# Patient Record
Sex: Female | Born: 1985 | Hispanic: Yes | Marital: Single | Smoking: Never smoker
Health system: Southern US, Community
[De-identification: ages and names within clinical notes are randomized; demographics above are authoritative.]

## PROBLEM LIST (undated history)

## (undated) DIAGNOSIS — R87629 Unspecified abnormal cytological findings in specimens from vagina: Secondary | ICD-10-CM

## (undated) DIAGNOSIS — O24419 Gestational diabetes mellitus in pregnancy, unspecified control: Secondary | ICD-10-CM

## (undated) HISTORY — DX: Unspecified abnormal cytological findings in specimens from vagina: R87.629

## (undated) HISTORY — PX: NO PAST SURGERIES: SHX2092

---

## 2018-05-20 ENCOUNTER — Ambulatory Visit (INDEPENDENT_AMBULATORY_CARE_PROVIDER_SITE_OTHER): Payer: Medicaid Other | Admitting: Obstetrics & Gynecology

## 2018-05-20 ENCOUNTER — Encounter: Payer: Self-pay | Admitting: Obstetrics & Gynecology

## 2018-05-20 ENCOUNTER — Other Ambulatory Visit (HOSPITAL_COMMUNITY)
Admission: RE | Admit: 2018-05-20 | Discharge: 2018-05-20 | Disposition: A | Discharge status: Home / Self Care | Payer: Medicaid Other | Source: Ambulatory Visit | Attending: Obstetrics & Gynecology | Admitting: Obstetrics & Gynecology

## 2018-05-20 VITALS — BP 135/83 | HR 83 | Ht 64.0 in | Wt 150.0 lb

## 2018-05-20 DIAGNOSIS — Z1151 Encounter for screening for human papillomavirus (HPV): Secondary | ICD-10-CM | POA: Insufficient documentation

## 2018-05-20 DIAGNOSIS — Z3401 Encounter for supervision of normal first pregnancy, first trimester: Secondary | ICD-10-CM

## 2018-05-20 DIAGNOSIS — O282 Abnormal cytological finding on antenatal screening of mother: Secondary | ICD-10-CM | POA: Insufficient documentation

## 2018-05-20 DIAGNOSIS — Z3687 Encounter for antenatal screening for uncertain dates: Secondary | ICD-10-CM | POA: Diagnosis not present

## 2018-05-20 DIAGNOSIS — Z3400 Encounter for supervision of normal first pregnancy, unspecified trimester: Secondary | ICD-10-CM | POA: Insufficient documentation

## 2018-05-20 DIAGNOSIS — Z8349 Family history of other endocrine, nutritional and metabolic diseases: Secondary | ICD-10-CM | POA: Diagnosis not present

## 2018-05-20 DIAGNOSIS — Z3A10 10 weeks gestation of pregnancy: Secondary | ICD-10-CM | POA: Insufficient documentation

## 2018-05-20 LAB — OB RESULTS CONSOLE GC/CHLAMYDIA: Gonorrhea: NEGATIVE

## 2018-05-20 MED ORDER — PROMETHAZINE HCL 25 MG PO TABS: 25.0000 mg | ORAL_TABLET | Freq: Four times a day (QID) | ORAL | 2 refills | Status: DC | PRN

## 2018-05-20 MED ORDER — DOCUSATE SODIUM 100 MG PO CAPS: 100.0000 mg | ORAL_CAPSULE | Freq: Two times a day (BID) | ORAL | 2 refills | Status: DC | PRN

## 2018-05-20 NOTE — Addendum Note (Signed)
Addended by: Kathie DikeSOLA, DEANNA J on: 05/20/2018 04:33 PM   Modules accepted: Orders

## 2018-05-20 NOTE — Progress Notes (Signed)
Bedside U/S shows IUP with FHT of 178 BPM and CRL 28.682mm  GA 4655w5d

## 2018-05-20 NOTE — Progress Notes (Signed)
  Subjective:    Sabrina Rogers is a G1P0 3655w2d being seen today for her first obstetrical visit.  Her obstetrical history is significant for first pregnancy.  Having nausea and vomiting.  Lost 3 pounds.  Negative ketones on dip today.. Patient does intend to breast feed. Pregnancy history fully reviewed.  Patient reports heartburn, nausea and vomiting.  Vitals:   05/20/18 1310 05/20/18 1313  BP: 135/83   Pulse: 83   Weight: 150 lb (68 kg)   Height:  5\' 4"  (1.626 m)    HISTORY: OB History  Gravida Para Term Preterm AB Living  1            SAB TAB Ectopic Multiple Live Births               # Outcome Date GA Lbr Len/2nd Weight Sex Delivery Anes PTL Lv  1 Current            History reviewed. No pertinent past medical history. History reviewed. No pertinent surgical history. Family History  Problem Relation Age of Onset  . Hypertension Mother   . Hypothyroidism Mother   . Hypertension Father      Exam    Uterus:     Pelvic Exam:    Perineum: No Hemorrhoids   Vulva: normal   Vagina:  normal mucosa   pH: n/a   Cervix: no cervical motion tenderness and no lesions   Adnexa: normal adnexa   Bony Pelvis: average  System: Breast:  Inspection negative   Skin: normal coloration and turgor, no rashes    Neurologic: oriented, normal mood   Extremities: no deformities   HEENT sclera clear, anicteric, oropharynx clear, no lesions, neck supple with midline trachea, thyroid without masses and trachea midline   Mouth/Teeth mucous membranes moist, pharynx normal without lesions and dental hygiene good   Neck supple and no masses   Cardiovascular: regular rate and rhythm   Respiratory:  appears well, vitals normal, no respiratory distress, acyanotic, normal RR, chest clear, no wheezing, crepitations, rhonchi, normal symmetric air entry   Abdomen: soft, non-tender; bowel sounds normal; no masses,  no organomegaly   Urinary: urethral meatus normal      Assessment:    Pregnancy:  G1P0 Patient Active Problem List   Diagnosis Date Noted  . Supervision of normal first pregnancy, antepartum 05/20/2018        Plan:     Initial labs drawn. Prenatal vitamins. Problem list reviewed and updated. Genetic Screening discussed:  NIPS and AFP  Ultrasound discussed; fetal survey: ordered.  Babyscripts reduced visit--will have pt come at 12 weeks for NIPS; 15-16 weeks for next OB visit and AFP, then 20-21 weeks for the following (the 3rd OB visit)  Reviewed weight gain (BMI 26 when conceived  Phenergan for N/V Colace for constipation Tums for reflux.      Elsie LincolnKelly Leggett 05/20/2018

## 2018-05-21 LAB — TSH: TSH: 1.19 mIU/L

## 2018-05-22 LAB — URINE CULTURE, OB REFLEX

## 2018-05-22 LAB — CULTURE, OB URINE

## 2018-05-23 LAB — CYTOLOGY - PAP
CHLAMYDIA, DNA PROBE: NEGATIVE
Diagnosis: UNDETERMINED — AB
HPV: DETECTED — AB
NEISSERIA GONORRHEA: NEGATIVE

## 2018-05-25 LAB — OBSTETRIC PANEL
Antibody Screen: NOT DETECTED
BASOS PCT: 0.5 %
Basophils Absolute: 49 cells/uL (ref 0–200)
Eosinophils Absolute: 59 cells/uL (ref 15–500)
Eosinophils Relative: 0.6 %
HEMATOCRIT: 39.4 % (ref 35.0–45.0)
HEMOGLOBIN: 13 g/dL (ref 11.7–15.5)
Hepatitis B Surface Ag: NONREACTIVE
Lymphs Abs: 1431 cells/uL (ref 850–3900)
MCH: 29.9 pg (ref 27.0–33.0)
MCHC: 33 g/dL (ref 32.0–36.0)
MCV: 90.6 fL (ref 80.0–100.0)
MPV: 10.6 fL (ref 7.5–12.5)
Monocytes Relative: 5 %
Neutro Abs: 7771 cells/uL (ref 1500–7800)
Neutrophils Relative %: 79.3 %
Platelets: 318 10*3/uL (ref 140–400)
RBC: 4.35 10*6/uL (ref 3.80–5.10)
RDW: 11.8 % (ref 11.0–15.0)
RPR Ser Ql: NONREACTIVE
Rubella: 8.22 index
TOTAL LYMPHOCYTE: 14.6 %
WBC: 9.8 10*3/uL (ref 3.8–10.8)
WBCMIX: 490 {cells}/uL (ref 200–950)

## 2018-05-25 LAB — HIV ANTIBODY (ROUTINE TESTING W REFLEX): HIV 1&2 Ab, 4th Generation: NONREACTIVE

## 2018-05-25 LAB — CYSTIC FIBROSIS DIAGNOSTIC STUDY

## 2018-05-25 LAB — SICKLE CELL SCREEN: Sickle Solubility Test - HGBRFX: NEGATIVE

## 2018-05-27 ENCOUNTER — Encounter: Payer: Self-pay | Admitting: Obstetrics & Gynecology

## 2018-05-27 DIAGNOSIS — R87810 Cervical high risk human papillomavirus (HPV) DNA test positive: Secondary | ICD-10-CM

## 2018-05-27 DIAGNOSIS — R87610 Atypical squamous cells of undetermined significance on cytologic smear of cervix (ASC-US): Secondary | ICD-10-CM | POA: Insufficient documentation

## 2018-06-03 ENCOUNTER — Ambulatory Visit: Payer: Medicaid Other

## 2018-06-03 ENCOUNTER — Ambulatory Visit (INDEPENDENT_AMBULATORY_CARE_PROVIDER_SITE_OTHER): Payer: Medicaid Other | Admitting: Obstetrics & Gynecology

## 2018-06-03 VITALS — BP 127/77 | HR 88 | Wt 151.0 lb

## 2018-06-03 DIAGNOSIS — Z3400 Encounter for supervision of normal first pregnancy, unspecified trimester: Secondary | ICD-10-CM

## 2018-06-03 DIAGNOSIS — R87610 Atypical squamous cells of undetermined significance on cytologic smear of cervix (ASC-US): Secondary | ICD-10-CM

## 2018-06-03 DIAGNOSIS — Z3401 Encounter for supervision of normal first pregnancy, first trimester: Secondary | ICD-10-CM

## 2018-06-03 DIAGNOSIS — Z1379 Encounter for other screening for genetic and chromosomal anomalies: Secondary | ICD-10-CM

## 2018-06-03 NOTE — Progress Notes (Signed)
Colpo and NIPS

## 2018-06-03 NOTE — Progress Notes (Signed)
   PRENATAL VISIT NOTE  Subjective:  Sabrina Rogers is a 32 y.o. G1P0 at 1930w2d being seen today for ongoing prenatal care.  She is currently monitored for the following issues for this low-risk pregnancy and has Supervision of normal first pregnancy, antepartum and ASCUS with positive high risk HPV cervical on their problem list.  Patient reports no complaints.   . Vag. Bleeding: None.  Movement: Absent. Denies leaking of fluid.   The following portions of the patient's history were reviewed and updated as appropriate: allergies, current medications, past family history, past medical history, past social history, past surgical history and problem list. Problem list updated.  Objective:   Vitals:   06/03/18 1403  BP: 127/77  Pulse: 88  Weight: 151 lb (68.5 kg)    Fetal Status:     Movement: Absent     General:  Alert, oriented and cooperative. Patient is in no acute distress.  Skin: Skin is warm and dry. No rash noted.   Cardiovascular: Normal heart rate noted  Respiratory: Normal respiratory effort, no problems with respiration noted  Abdomen: Soft, gravid, appropriate for gestational age.        Pelvic: Cervical exam deferred        Extremities: Normal range of motion.  Edema: None  Mental Status: Normal mood and affect. Normal behavior. Normal judgment and thought content.  UPT negative, consent signed, time out done Cervix prepped with acetic acid. Transformation zone seen in its entirety. Colpo adequate. colpo normal She tolerated the procedure well.   Assessment and Plan:  Pregnancy: G1P0 at 7930w2d  1. Genetic screening  - Genetic Screening  2. Supervision of normal first pregnancy, antepartum 3. ASCUS- colpo normal today, rec repeat pap post partum  Preterm labor symptoms and general obstetric precautions including but not limited to vaginal bleeding, contractions, leaking of fluid and fetal movement were reviewed in detail with the patient. Please refer to After  Visit Summary for other counseling recommendations.  Return in about 4 weeks (around 07/01/2018).  Future Appointments  Date Time Provider Department Center  07/01/2018  1:15 PM Allie Bossierove, Evadene Wardrip C, MD [facility]    Allie BossierMyra C Loie Jahr, MD

## 2018-06-04 ENCOUNTER — Encounter: Payer: Self-pay | Admitting: *Deleted

## 2018-06-04 DIAGNOSIS — Z3400 Encounter for supervision of normal first pregnancy, unspecified trimester: Secondary | ICD-10-CM

## 2018-06-18 ENCOUNTER — Other Ambulatory Visit: Payer: Medicaid Other

## 2018-07-01 ENCOUNTER — Ambulatory Visit (INDEPENDENT_AMBULATORY_CARE_PROVIDER_SITE_OTHER): Payer: Medicaid Other | Admitting: Obstetrics & Gynecology

## 2018-07-01 VITALS — Wt 148.0 lb

## 2018-07-01 DIAGNOSIS — Z3400 Encounter for supervision of normal first pregnancy, unspecified trimester: Secondary | ICD-10-CM

## 2018-07-01 DIAGNOSIS — Z23 Encounter for immunization: Secondary | ICD-10-CM | POA: Diagnosis not present

## 2018-07-01 NOTE — Addendum Note (Signed)
Addended by: Granville Lewis on: 07/01/2018 01:56 PM   Modules accepted: Orders

## 2018-07-02 LAB — ALPHA FETOPROTEIN, MATERNAL
AFP MoM: 1.15
AFP, Serum: 36 ng/mL
CALC'D GESTATIONAL AGE: 16.3 wk
Maternal Wt: 151 [lb_av]
Risk for ONTD: <1
TWINS-AFP: 1

## 2018-07-15 ENCOUNTER — Encounter (HOSPITAL_COMMUNITY): Payer: Self-pay

## 2018-07-22 ENCOUNTER — Other Ambulatory Visit (HOSPITAL_COMMUNITY): Payer: Self-pay | Admitting: *Deleted

## 2018-07-22 ENCOUNTER — Ambulatory Visit (HOSPITAL_COMMUNITY)
Admission: RE | Admit: 2018-07-22 | Discharge: 2018-07-22 | Disposition: A | Discharge status: Home / Self Care | Payer: Medicaid Other | Source: Ambulatory Visit | Attending: Obstetrics & Gynecology | Admitting: Obstetrics & Gynecology

## 2018-07-22 ENCOUNTER — Ambulatory Visit (HOSPITAL_COMMUNITY): Payer: Medicaid Other

## 2018-07-22 DIAGNOSIS — Z363 Encounter for antenatal screening for malformations: Secondary | ICD-10-CM | POA: Diagnosis present

## 2018-07-22 DIAGNOSIS — Z3A19 19 weeks gestation of pregnancy: Secondary | ICD-10-CM

## 2018-07-22 DIAGNOSIS — Z362 Encounter for other antenatal screening follow-up: Secondary | ICD-10-CM

## 2018-07-22 DIAGNOSIS — Z3400 Encounter for supervision of normal first pregnancy, unspecified trimester: Secondary | ICD-10-CM

## 2018-07-29 ENCOUNTER — Encounter: Payer: Medicaid Other | Admitting: Obstetrics & Gynecology

## 2018-07-31 ENCOUNTER — Ambulatory Visit (INDEPENDENT_AMBULATORY_CARE_PROVIDER_SITE_OTHER): Payer: Medicaid Other | Admitting: Obstetrics and Gynecology

## 2018-07-31 ENCOUNTER — Encounter: Payer: Self-pay | Admitting: Obstetrics and Gynecology

## 2018-07-31 DIAGNOSIS — Z3402 Encounter for supervision of normal first pregnancy, second trimester: Secondary | ICD-10-CM

## 2018-07-31 DIAGNOSIS — Z3400 Encounter for supervision of normal first pregnancy, unspecified trimester: Secondary | ICD-10-CM

## 2018-07-31 LAB — POCT URINALYSIS DIPSTICK OB
Glucose, UA: NEGATIVE
POC,PROTEIN,UA: NEGATIVE

## 2018-07-31 NOTE — Patient Instructions (Signed)
You will be offered the Tdap vaccine at your next visit. Here is some information on it  DTaP Vaccine (Diphtheria, Tetanus, and Pertussis): What You Need to Know 1. Why get vaccinated? Diphtheria, tetanus, and pertussis are serious diseases caused by bacteria. Diphtheria and pertussis are spread from person to person. Tetanus enters the body through cuts or wounds. DIPHTHERIA causes a thick covering in the back of the throat.  It can lead to breathing problems, paralysis, heart failure, and even death.  TETANUS (Lockjaw) causes painful tightening of the muscles, usually all over the body.  It can lead to "locking" of the jaw so the victim cannot open his mouth or swallow. Tetanus leads to death in up to 2 out of 10 cases.  PERTUSSIS (Whooping Cough) causes coughing spells so bad that it is hard for infants to eat, drink, or breathe. These spells can last for weeks.  It can lead to pneumonia, seizures (jerking and staring spells), brain damage, and death.  Diphtheria, tetanus, and pertussis vaccine (DTaP) can help prevent these diseases. Most children who are vaccinated with DTaP will be protected throughout childhood. Many more children would get these diseases if we stopped vaccinating. DTaP is a safer version of an older vaccine called DTP. DTP is no longer used in the [place]. 2. Who should get DTaP vaccine and when? Children should get 5 doses of DTaP vaccine, one dose at each of the following ages:  2 months  4 months  6 months  15-18 months  4-6 years  DTaP may be given at the same time as other vaccines. 3. Some children should not get DTaP vaccine or should wait  Children with minor illnesses, such as a cold, may be vaccinated. But children who are moderately or severely ill should usually wait until they recover before getting DTaP vaccine.  Any child who had a life-threatening allergic reaction after a dose of DTaP should not get another dose.  Any child who  suffered a brain or nervous system disease within 7 days after a dose of DTaP should not get another dose.  Talk with your doctor if your child: ? had a seizure or collapsed after a dose of DTaP, ? cried non-stop for 3 hours or more after a dose of DTaP, ? had a fever over 105F after a dose of DTaP. Ask your doctor for more information. Some of these children should not get another dose of pertussis vaccine, but may get a vaccine without pertussis, called DT. 4. Older children and adults DTaP is not licensed for adolescents, adults, or children 74 years of age and older. But older people still need protection. A vaccine called Tdap is similar to DTaP. A single dose of Tdap is recommended for people 11 through 31 years of age. Another vaccine, called Td, protects against tetanus and diphtheria, but not pertussis. It is recommended every 10 years. There are separate Vaccine Information Statements for these vaccines. 5. What are the risks from DTaP vaccine? Getting diphtheria, tetanus, or pertussis disease is much riskier than getting DTaP vaccine. However, a vaccine, like any medicine, is capable of causing serious problems, such as severe allergic reactions. The risk of DTaP vaccine causing serious harm, or death, is extremely small. Mild problems (common)  Fever (up to about 1 child in 4)  Redness or swelling where the shot was given (up to about 1 child in 4)  Soreness or tenderness where the shot was given (up to about 1 child in  4) These problems occur more often after the 4th and 5th doses of the DTaP series than after earlier doses. Sometimes the 4th or 5th dose of DTaP vaccine is followed by swelling of the entire arm or leg in which the shot was given, lasting 1-7 days (up to about 1 child in 30). Other mild problems include:  Fussiness (up to about 1 child in 3)  Tiredness or poor appetite (up to about 1 child in 10)  Vomiting (up to about 1 child in 50) These problems generally  occur 1-3 days after the shot. Moderate problems (uncommon)  Seizure (jerking or staring) (about 1 child out of 14,000)  Non-stop crying, for 3 hours or more (up to about 1 child out of 1,000)  High fever, over 105F (about 1 child out of 16,000) Severe problems (very rare)  Serious allergic reaction (less than 1 out of a million doses)  Several other severe problems have been reported after DTaP vaccine. These include: ? Long-term seizures, coma, or lowered consciousness ? Permanent brain damage. These are so rare it is hard to tell if they are caused by the vaccine. Controlling fever is especially important for children who have had seizures, for any reason. It is also important if another family member has had seizures. You can reduce fever and pain by giving your child an aspirin-free pain reliever when the shot is given, and for the next 24 hours, following the package instructions. 6. What if there is a serious reaction? What should I look for? Look for anything that concerns you, such as signs of a severe allergic reaction, very high fever, or behavior changes. Signs of a severe allergic reaction can include hives, swelling of the face and throat, difficulty breathing, a fast heartbeat, dizziness, and weakness. These would start a few minutes to a few hours after the vaccination. What should I do?  If you think it is a severe allergic reaction or other emergency that can't wait, call 9-1-1 or get the person to the nearest hospital. Otherwise, call your doctor.  Afterward, the reaction should be reported to the Vaccine Adverse Event Reporting System (VAERS). Your doctor might file this report, or you can do it yourself through the VAERS web site at www.vaers.LAgents.no, or by calling 1-959-336-9317. ? VAERS is only for reporting reactions. They do not give medical advice. 7. The National Vaccine Injury Compensation Program The [employer] Vaccine Injury Compensation Program (VICP) is a  federal program that was created to compensate people who may have been injured by certain vaccines. Persons who believe they may have been injured by a vaccine can learn about the program and about filing a claim by calling 1-910-444-2804 or visiting the VICP website at SpiritualWord.at. 8. How can I learn more?  Ask your doctor.  Call your local or state health department.  Contact the Centers for Disease Control and Prevention (CDC): ? Call (418)460-5252 (1-800-CDC-INFO) or ? Visit CDC's website at PicCapture.uy CDC DTaP Vaccine (Diphtheria, Tetanus, and Pertussis) VIS (03/08/06) This information is not intended to replace advice given to you by your health care provider. Make sure you discuss any questions you have with your health care provider. Document Released: 08/06/2006 Document Revised: 06/29/2016 Document Reviewed: 06/29/2016 Elsevier Interactive Patient Education  2017 [employer].

## 2018-07-31 NOTE — Progress Notes (Signed)
   PRENATAL VISIT NOTE  Subjective:  Sabrina Rogers is a 32 y.o. G1P0 at [redacted]w[redacted]d being seen today for ongoing prenatal care.  She is currently monitored for the following issues for this low-risk pregnancy and has Supervision of normal first pregnancy, antepartum and ASCUS with positive high risk HPV cervical on their problem list.  Patient reports no complaints.   . Vag. Bleeding: None.  Movement: Present. Denies leaking of fluid.   The following portions of the patient's history were reviewed and updated as appropriate: allergies, current medications, past family history, past medical history, past social history, past surgical history and problem list. Problem list updated.  Objective:   Vitals:   07/31/18 0900  BP: 124/86  Pulse: 81  Weight: 154 lb (69.9 kg)    Fetal Status: Fetal Heart Rate (bpm): 146 Fundal Height: 20 cm Movement: Present     General:  Alert, oriented and cooperative. Patient is in no acute distress.  Skin: Skin is warm and dry. No rash noted.   Cardiovascular: Normal heart rate noted  Respiratory: Normal respiratory effort, no problems with respiration noted  Abdomen: Soft, gravid, appropriate for gestational age.  Pain/Pressure: Absent     Pelvic: Cervical exam deferred        Extremities: Normal range of motion.  Edema: None  Mental Status: Normal mood and affect. Normal behavior. Normal judgment and thought content.   Assessment and Plan:  Pregnancy: G1P0 at [redacted]w[redacted]d  1. Supervision of normal first pregnancy, antepartum Patient is doing well without complaints Follow up anatomy ultrasound 11/4 Negative genetic screening test Third trimester labs and glucola next visit  Preterm labor symptoms and general obstetric precautions including but not limited to vaginal bleeding, contractions, leaking of fluid and fetal movement were reviewed in detail with the patient. Please refer to After Visit Summary for other counseling recommendations.  Return in about 8  weeks (around 09/25/2018) for ROB, 2 hr glucola next visit.  Future Appointments  Date Time Provider Department Center  08/26/2018  2:45 PM [facility] [place] [facility]    Catalina Antigua, MD

## 2018-08-26 ENCOUNTER — Ambulatory Visit (HOSPITAL_COMMUNITY)
Admission: RE | Admit: 2018-08-26 | Discharge: 2018-08-26 | Disposition: A | Discharge status: Home / Self Care | Payer: Medicaid Other | Source: Ambulatory Visit | Attending: Obstetrics & Gynecology | Admitting: Obstetrics & Gynecology

## 2018-08-26 DIAGNOSIS — Z362 Encounter for other antenatal screening follow-up: Secondary | ICD-10-CM

## 2018-08-26 DIAGNOSIS — Z3A24 24 weeks gestation of pregnancy: Secondary | ICD-10-CM

## 2018-08-28 ENCOUNTER — Ambulatory Visit: Payer: Medicaid Other

## 2018-09-23 ENCOUNTER — Encounter: Payer: Medicaid Other | Admitting: Obstetrics & Gynecology

## 2018-09-23 ENCOUNTER — Ambulatory Visit (INDEPENDENT_AMBULATORY_CARE_PROVIDER_SITE_OTHER): Payer: Medicaid Other | Admitting: Obstetrics & Gynecology

## 2018-09-23 VITALS — BP 119/86 | HR 94 | Wt 161.0 lb

## 2018-09-23 DIAGNOSIS — Z23 Encounter for immunization: Secondary | ICD-10-CM | POA: Diagnosis not present

## 2018-09-23 DIAGNOSIS — Z3400 Encounter for supervision of normal first pregnancy, unspecified trimester: Secondary | ICD-10-CM

## 2018-09-23 DIAGNOSIS — Z3403 Encounter for supervision of normal first pregnancy, third trimester: Secondary | ICD-10-CM

## 2018-09-23 NOTE — Progress Notes (Signed)
   PRENATAL VISIT NOTE  Subjective:  Illene LabradorFatima Rogers is a 32 y.o. G1P0 at 8346w2d being seen today for ongoing prenatal care.  She is currently monitored for the following issues for this low-risk pregnancy and has Supervision of normal first pregnancy, antepartum and ASCUS with positive high risk HPV cervical on their problem list.  Patient reports no complaints.   . Vag. Bleeding: None.  Movement: Present. Denies leaking of fluid.   The following portions of the patient's history were reviewed and updated as appropriate: allergies, current medications, past family history, past medical history, past social history, past surgical history and problem list. Problem list updated.  Objective:   Vitals:   09/23/18 0809  BP: 119/86  Pulse: 94  Weight: 161 lb (73 kg)    Fetal Status: Fetal Heart Rate (bpm): 151   Movement: Present     General:  Alert, oriented and cooperative. Patient is in no acute distress.  Skin: Skin is warm and dry. No rash noted.   Cardiovascular: Normal heart rate noted  Respiratory: Normal respiratory effort, no problems with respiration noted  Abdomen: Soft, gravid, appropriate for gestational age.  Pain/Pressure: Absent     Pelvic: Cervical exam deferred        Extremities: Normal range of motion.  Edema: None  Mental Status: Normal mood and affect. Normal behavior. Normal judgment and thought content.   Assessment and Plan:  Pregnancy: G1P0 at 6646w2d  1. Supervision of normal first pregnancy, antepartum -Babyscripts not syncing.  Shows she has been in the APP.  [facility] is going to [facility] with her today and make sure values are syncing.   - CBC - HIV antibody (with reflex) - RPR - 2Hr GTT w/ 1 Hr Carpenter 75 g - Tdap vaccine greater than or equal to 7yo IM  Preterm labor symptoms and general obstetric precautions including but not limited to vaginal bleeding, contractions, leaking of fluid and fetal movement were reviewed in detail with the patient. Please  refer to After Visit Summary for other counseling recommendations.  Return in about 4 weeks (around 10/21/2018).  Future Appointments  Date Time Provider Department Center  10/21/2018  9:00 AM Allie Bossierove, Myra C, MD [facility]    Elsie LincolnKelly , MD

## 2018-09-23 NOTE — Patient Instructions (Signed)
Contraception Choices Contraception, also called birth control, refers to methods or devices that prevent pregnancy. Hormonal methods Contraceptive implant A contraceptive implant is a thin, plastic tube that contains a hormone. It is inserted into the upper part of the arm. It can remain in place for up to 3 years. Progestin-only injections Progestin-only injections are injections of progestin, a synthetic form of the hormone progesterone. They are given every 3 months by a health care provider. Birth control pills Birth control pills are pills that contain hormones that prevent pregnancy. They must be taken once a day, preferably at the same time each day. Birth control patch The birth control patch contains hormones that prevent pregnancy. It is placed on the skin and must be changed once a week for three weeks and removed on the fourth week. A prescription is needed to use this method of contraception. Vaginal ring A vaginal ring contains hormones that prevent pregnancy. It is placed in the vagina for three weeks and removed on the fourth week. After that, the process is repeated with a new ring. A prescription is needed to use this method of contraception. Emergency contraceptive Emergency contraceptives prevent pregnancy after unprotected sex. They come in pill form and can be taken up to 5 days after sex. They work best the sooner they are taken after having sex. Most emergency contraceptives are available without a prescription. This method should not be used as your only form of birth control. Barrier methods Female condom A female condom is a thin sheath that is worn over the penis during sex. Condoms keep sperm from going inside a woman's body. They can be used with a spermicide to increase their effectiveness. They should be disposed after a single use. Female condom A female condom is a soft, loose-fitting sheath that is put into the vagina before sex. The condom keeps sperm from going  inside a woman's body. They should be disposed after a single use. Diaphragm A diaphragm is a soft, dome-shaped barrier. It is inserted into the vagina before sex, along with a spermicide. The diaphragm blocks sperm from entering the uterus, and the spermicide kills sperm. A diaphragm should be left in the vagina for 6-8 hours after sex and removed within 24 hours. A diaphragm is prescribed and fitted by a health care provider. A diaphragm should be replaced every 1-2 years, after giving birth, after gaining more than 15 lb (6.8 kg), and after pelvic surgery. Cervical cap A cervical cap is a round, soft latex or plastic cup that fits over the cervix. It is inserted into the vagina before sex, along with spermicide. It blocks sperm from entering the uterus. The cap should be left in place for 6-8 hours after sex and removed within 48 hours. A cervical cap must be prescribed and fitted by a health care provider. It should be replaced every 2 years. Sponge A sponge is a soft, circular piece of polyurethane foam with spermicide on it. The sponge helps block sperm from entering the uterus, and the spermicide kills sperm. To use it, you make it wet and then insert it into the vagina. It should be inserted before sex, left in for at least 6 hours after sex, and removed and thrown away within 30 hours. Spermicides Spermicides are chemicals that kill or block sperm from entering the cervix and uterus. They can come as a cream, jelly, suppository, foam, or tablet. A spermicide should be inserted into the vagina with an applicator at least 10-15 minutes before   sex to allow time for it to work. The process must be repeated every time you have sex. Spermicides do not require a prescription. Intrauterine contraception Intrauterine device (IUD) An IUD is a T-shaped device that is put in a woman's uterus. There are two types:  Hormone IUD.This type contains progestin, a synthetic form of the hormone progesterone. This  type can stay in place for 3-5 years.  Copper IUD.This type is wrapped in copper wire. It can stay in place for 10 years.  Permanent methods of contraception Female tubal ligation In this method, a woman's fallopian tubes are sealed, tied, or blocked during surgery to prevent eggs from traveling to the uterus. Hysteroscopic sterilization In this method, a small, flexible insert is placed into each fallopian tube. The inserts cause scar tissue to form in the fallopian tubes and block them, so sperm cannot reach an egg. The procedure takes about 3 months to be effective. Another form of birth control must be used during those 3 months. Female sterilization This is a procedure to tie off the tubes that carry sperm (vasectomy). After the procedure, the man can still ejaculate fluid (semen). Natural planning methods Natural family planning In this method, a couple does not have sex on days when the woman could become pregnant. Calendar method This means keeping track of the length of each menstrual cycle, identifying the days when pregnancy can happen, and not having sex on those days. Ovulation method In this method, a couple avoids sex during ovulation. Symptothermal method This method involves not having sex during ovulation. The woman typically checks for ovulation by watching changes in her temperature and in the consistency of cervical mucus. Post-ovulation method In this method, a couple waits to have sex until after ovulation. Summary  Contraception, also called birth control, means methods or devices that prevent pregnancy.  Hormonal methods of contraception include implants, injections, pills, patches, vaginal rings, and emergency contraceptives.  Barrier methods of contraception can include female condoms, female condoms, diaphragms, cervical caps, sponges, and spermicides.  There are two types of IUDs (intrauterine devices). An IUD can be put in a woman's uterus to prevent pregnancy  for 3-5 years.  Permanent sterilization can be done through a procedure for males, females, or both.  Natural family planning methods involve not having sex on days when the woman could become pregnant. This information is not intended to replace advice given to you by your health care provider. Make sure you discuss any questions you have with your health care provider. Document Released: 10/09/2005 Document Revised: 11/11/2016 Document Reviewed: 11/11/2016 Elsevier Interactive Patient Education  2018 [employer]. Levonorgestrel intrauterine device (IUD) What is this medicine? LEVONORGESTREL IUD (LEE voe nor jes trel) is a contraceptive (birth control) device. The device is placed inside the uterus by a healthcare professional. It is used to prevent pregnancy. This device can also be used to treat heavy bleeding that occurs during your period. This medicine may be used for other purposes; ask your health care provider or pharmacist if you have questions. COMMON BRAND NAME(S): Minette Headland What should I tell my health care provider before I take this medicine? They need to know if you have any of these conditions: -abnormal Pap smear -cancer of the breast, uterus, or cervix -diabetes -endometritis -genital or pelvic infection now or in the past -have more than one sexual partner or your partner has more than one partner -heart disease -history of an ectopic or tubal pregnancy -immune system problems -IUD  in place -liver disease or tumor -problems with blood clots or take blood-thinners -seizures -use intravenous drugs -uterus of unusual shape -vaginal bleeding that has not been explained -an unusual or allergic reaction to levonorgestrel, other hormones, silicone, or polyethylene, medicines, foods, dyes, or preservatives -pregnant or trying to get pregnant -breast-feeding How should I use this medicine? This device is placed inside the uterus by a health care  professional. Talk to your pediatrician regarding the use of this medicine in children. Special care may be needed. Overdosage: If you think you have taken too much of this medicine contact a poison control center or emergency room at once. NOTE: This medicine is only for you. Do not share this medicine with others. What if I miss a dose? This does not apply. Depending on the brand of device you have inserted, the device will need to be replaced every 3 to 5 years if you wish to continue using this type of birth control. What may interact with this medicine? Do not take this medicine with any of the following medications: -amprenavir -bosentan -fosamprenavir This medicine may also interact with the following medications: -aprepitant -armodafinil -barbiturate medicines for inducing sleep or treating seizures -bexarotene -boceprevir -griseofulvin -medicines to treat seizures like carbamazepine, ethotoin, felbamate, oxcarbazepine, phenytoin, topiramate -modafinil -pioglitazone -rifabutin -rifampin -rifapentine -some medicines to treat HIV infection like atazanavir, efavirenz, indinavir, lopinavir, nelfinavir, tipranavir, ritonavir -St. John's wort -warfarin This list may not describe all possible interactions. Give your health care provider a list of all the medicines, herbs, non-prescription drugs, or dietary supplements you use. Also tell them if you smoke, drink alcohol, or use illegal drugs. Some items may interact with your medicine. What should I watch for while using this medicine? Visit your doctor or health care professional for regular check ups. See your doctor if you or your partner has sexual contact with others, becomes HIV positive, or gets a sexual transmitted disease. This product does not protect you against HIV infection (AIDS) or other sexually transmitted diseases. You can check the placement of the IUD yourself by reaching up to the top of your vagina with clean  fingers to feel the threads. Do not pull on the threads. It is a good habit to check placement after each menstrual period. Call your doctor right away if you feel more of the IUD than just the threads or if you cannot feel the threads at all. The IUD may come out by itself. You may become pregnant if the device comes out. If you notice that the IUD has come out use a backup birth control method like condoms and call your health care provider. Using tampons will not change the position of the IUD and are okay to use during your period. This IUD can be safely scanned with magnetic resonance imaging (MRI) only under specific conditions. Before you have an MRI, tell your healthcare provider that you have an IUD in place, and which type of IUD you have in place. What side effects may I notice from receiving this medicine? Side effects that you should report to your doctor or health care professional as soon as possible: -allergic reactions like skin rash, itching or hives, swelling of the face, lips, or tongue -fever, flu-like symptoms -genital sores -high blood pressure -no menstrual period for 6 weeks during use -pain, swelling, warmth in the leg -pelvic pain or tenderness -severe or sudden headache -signs of pregnancy -stomach cramping -sudden shortness of breath -trouble with balance, talking, or walking -unusual vaginal  bleeding, discharge -yellowing of the eyes or skin Side effects that usually do not require medical attention (report to your doctor or health care professional if they continue or are bothersome): -acne -breast pain -change in sex drive or performance -changes in weight -cramping, dizziness, or faintness while the device is being inserted -headache -irregular menstrual bleeding within first 3 to 6 months of use -nausea This list may not describe all possible side effects. Call your doctor for medical advice about side effects. You may report side effects to FDA at  1-800-FDA-1088. Where should I keep my medicine? This does not apply. NOTE: This sheet is a summary. It may not cover all possible information. If you have questions about this medicine, talk to your doctor, pharmacist, or health care provider.  2018 Elsevier/Gold Standard (2016-07-21 14:14:56)

## 2018-09-24 ENCOUNTER — Telehealth: Payer: Self-pay | Admitting: *Deleted

## 2018-09-24 DIAGNOSIS — Z3400 Encounter for supervision of normal first pregnancy, unspecified trimester: Secondary | ICD-10-CM

## 2018-09-24 DIAGNOSIS — O24419 Gestational diabetes mellitus in pregnancy, unspecified control: Secondary | ICD-10-CM

## 2018-09-24 LAB — CBC
HCT: 34.6 % — ABNORMAL LOW (ref 35.0–45.0)
Hemoglobin: 11.4 g/dL — ABNORMAL LOW (ref 11.7–15.5)
MCH: 29.8 pg (ref 27.0–33.0)
MCHC: 32.9 g/dL (ref 32.0–36.0)
MCV: 90.3 fL (ref 80.0–100.0)
MPV: 10.6 fL (ref 7.5–12.5)
PLATELETS: 240 10*3/uL (ref 140–400)
RBC: 3.83 10*6/uL (ref 3.80–5.10)
RDW: 12.4 % (ref 11.0–15.0)
WBC: 6.9 10*3/uL (ref 3.8–10.8)

## 2018-09-24 LAB — 2HR GTT W 1 HR, CARPENTER, 75 G
GLUCOSE, 1 HR, GEST: 174 mg/dL (ref 65–179)
Glucose, 2 Hr, Gest: 165 mg/dL — ABNORMAL HIGH (ref 65–152)
Glucose, Fasting, Gest: 104 mg/dL — ABNORMAL HIGH (ref 65–91)

## 2018-09-24 LAB — RPR: RPR Ser Ql: NONREACTIVE

## 2018-09-24 LAB — HIV ANTIBODY (ROUTINE TESTING W REFLEX): HIV: NONREACTIVE

## 2018-09-24 NOTE — Telephone Encounter (Signed)
LM on voicemail that her 2 Hr GTT was abnormal and she is being referred to N&D for teaching and glucose monitoring

## 2018-10-02 ENCOUNTER — Encounter: Payer: Medicaid Other | Attending: Anesthesiology | Admitting: Registered"

## 2018-10-02 ENCOUNTER — Telehealth: Payer: Self-pay | Admitting: *Deleted

## 2018-10-02 DIAGNOSIS — O99810 Abnormal glucose complicating pregnancy: Secondary | ICD-10-CM | POA: Diagnosis present

## 2018-10-02 MED ORDER — GLUCOSE BLOOD VI STRP: ORAL_STRIP | 12 refills | Status: DC

## 2018-10-02 MED ORDER — ACCU-CHEK GUIDE ME W/DEVICE KIT: 1.0000 | PACK | Freq: Four times a day (QID) | 0 refills | Status: DC

## 2018-10-02 MED ORDER — ACCU-CHEK FASTCLIX LANCETS MISC: 4.0000 | Freq: Four times a day (QID) | 12 refills | Status: DC

## 2018-10-02 NOTE — Telephone Encounter (Signed)
Pt has gone to diabetic and nutrition class today.  Per nutritionist Accu-chek Guide me meter, lancets and strips were sent to [facility] in [place].

## 2018-10-04 ENCOUNTER — Encounter: Payer: Self-pay | Admitting: Registered"

## 2018-10-04 DIAGNOSIS — O99810 Abnormal glucose complicating pregnancy: Secondary | ICD-10-CM | POA: Insufficient documentation

## 2018-10-04 NOTE — Progress Notes (Signed)

## 2018-10-21 ENCOUNTER — Encounter: Payer: Self-pay | Admitting: Obstetrics & Gynecology

## 2018-10-21 ENCOUNTER — Ambulatory Visit (INDEPENDENT_AMBULATORY_CARE_PROVIDER_SITE_OTHER): Payer: Medicaid Other | Admitting: Obstetrics & Gynecology

## 2018-10-21 VITALS — BP 110/70 | HR 89 | Wt 161.0 lb

## 2018-10-21 DIAGNOSIS — O24419 Gestational diabetes mellitus in pregnancy, unspecified control: Secondary | ICD-10-CM

## 2018-10-21 DIAGNOSIS — Z3403 Encounter for supervision of normal first pregnancy, third trimester: Secondary | ICD-10-CM

## 2018-10-21 DIAGNOSIS — Z3400 Encounter for supervision of normal first pregnancy, unspecified trimester: Secondary | ICD-10-CM

## 2018-10-21 NOTE — Progress Notes (Signed)
   PRENATAL VISIT NOTE  Subjective:  Sabrina Rogers is a 32 y.o. G1P0 at 5225w2d being seen today for ongoing prenatal care.  She is currently monitored for the following issues for this high-risk pregnancy and has Supervision of normal first pregnancy, antepartum; ASCUS with positive high risk HPV cervical; Gestational diabetes; and Abnormal glucose tolerance test (GTT) during pregnancy, antepartum on their problem list.  Patient reports no complaints.   .  .  Movement: Present. Denies leaking of fluid.   The following portions of the patient's history were reviewed and updated as appropriate: allergies, current medications, past family history, past medical history, past social history, past surgical history and problem list. Problem list updated.  Objective:   Vitals:   10/21/18 0915  BP: 110/70  Pulse: 89  Weight: 161 lb (73 kg)    Fetal Status: Fetal Heart Rate (bpm): 150   Movement: Present     General:  Alert, oriented and cooperative. Patient is in no acute distress.  Skin: Skin is warm and dry. No rash noted.   Cardiovascular: Normal heart rate noted  Respiratory: Normal respiratory effort, no problems with respiration noted  Abdomen: Soft, gravid, appropriate for gestational age.  Pain/Pressure: Absent     Pelvic: Cervical exam deferred        Extremities: Normal range of motion.  Edema: None  Mental Status: Normal mood and affect. Normal behavior. Normal judgment and thought content.   Assessment and Plan:  Pregnancy: G1P0 at 2525w2d GDM- excellent sugars with only diet only- So good, that I have told her that she can check her sugars only once per day. If they are become elevated before her next visit, she will return to checking them more frequently. There are no diagnoses linked to this encounter. Preterm labor symptoms and general obstetric precautions including but not limited to vaginal bleeding, contractions, leaking of fluid and fetal movement were reviewed in detail  with the patient. Please refer to After Visit Summary for other counseling recommendations.  No follow-ups on file.  No future appointments.  Allie BossierMyra C Elisandro Jarrett, MD

## 2018-10-23 NOTE — L&D Delivery Note (Signed)
Patient: Sabrina Rogers MRN: [id number]  GBS status: negative   Patient is a 33 y.o. now G1P1 s/p NSVD at [redacted]w[redacted]d, who was admitted for IOL for PPROM. PPROM 85h 7m prior to delivery with clear fluid.    Delivery Note At 6:39 PM a viable female was delivered via Vaginal, Spontaneous (Presentation: OA, restituted LOA ;  ).  APGAR: 8, 9; weight  pending.   Placenta status: intact, spontaneous, .  Cord: 3 vessel     Anesthesia:  Epidural  Episiotomy: None Lacerations: 1st degree repaired Suture Repair: 3.0 Est. Blood Loss (mL): 91  Mom to postpartum.  Baby to NICU.  [name] 11/06/2018, 7:09 PM  Head delivered OA. No nuchal cord present. Shoulder and body delivered in usual fashion. Infant with spontaneous cry, placed on mother's abdomen, dried and bulb suctioned. Cord clamped x 2 after 1-minute delay, and cut by family member. Cord blood drawn. Placenta delivered spontaneously with gentle cord traction. Fundus firm with massage and Pitocin. Perineum inspected and found to have first degree laceration, which was repaired with good hemostasis.

## 2018-11-03 ENCOUNTER — Other Ambulatory Visit: Payer: Self-pay

## 2018-11-03 ENCOUNTER — Inpatient Hospital Stay (HOSPITAL_BASED_OUTPATIENT_CLINIC_OR_DEPARTMENT_OTHER): Payer: Medicaid Other

## 2018-11-03 ENCOUNTER — Encounter (HOSPITAL_COMMUNITY): Payer: Self-pay

## 2018-11-03 ENCOUNTER — Inpatient Hospital Stay (HOSPITAL_COMMUNITY)
Admission: AD | Admit: 2018-11-03 | Discharge: 2018-11-08 | DRG: 807 | Disposition: A | Discharge status: Home / Self Care | Payer: Medicaid Other | Attending: Obstetrics and Gynecology | Admitting: Obstetrics and Gynecology

## 2018-11-03 DIAGNOSIS — D649 Anemia, unspecified: Secondary | ICD-10-CM | POA: Diagnosis present

## 2018-11-03 DIAGNOSIS — O24420 Gestational diabetes mellitus in childbirth, diet controlled: Secondary | ICD-10-CM | POA: Diagnosis present

## 2018-11-03 DIAGNOSIS — O42913 Preterm premature rupture of membranes, unspecified as to length of time between rupture and onset of labor, third trimester: Secondary | ICD-10-CM | POA: Diagnosis present

## 2018-11-03 DIAGNOSIS — O42919 Preterm premature rupture of membranes, unspecified as to length of time between rupture and onset of labor, unspecified trimester: Secondary | ICD-10-CM | POA: Diagnosis present

## 2018-11-03 DIAGNOSIS — Z3400 Encounter for supervision of normal first pregnancy, unspecified trimester: Secondary | ICD-10-CM

## 2018-11-03 DIAGNOSIS — O24419 Gestational diabetes mellitus in pregnancy, unspecified control: Secondary | ICD-10-CM

## 2018-11-03 DIAGNOSIS — O4290 Premature rupture of membranes, unspecified as to length of time between rupture and onset of labor, unspecified weeks of gestation: Secondary | ICD-10-CM | POA: Diagnosis not present

## 2018-11-03 DIAGNOSIS — Z3A34 34 weeks gestation of pregnancy: Secondary | ICD-10-CM

## 2018-11-03 DIAGNOSIS — O42113 Preterm premature rupture of membranes, onset of labor more than 24 hours following rupture, third trimester: Secondary | ICD-10-CM | POA: Diagnosis not present

## 2018-11-03 DIAGNOSIS — O9902 Anemia complicating childbirth: Secondary | ICD-10-CM | POA: Diagnosis present

## 2018-11-03 HISTORY — DX: Gestational diabetes mellitus in pregnancy, unspecified control: O24.419

## 2018-11-03 LAB — CBC
HCT: 34.7 % — ABNORMAL LOW (ref 36.0–46.0)
Hemoglobin: 11.6 g/dL — ABNORMAL LOW (ref 12.0–15.0)
MCH: 29.8 pg (ref 26.0–34.0)
MCHC: 33.4 g/dL (ref 30.0–36.0)
MCV: 89.2 fL (ref 80.0–100.0)
NRBC: 0 % (ref 0.0–0.2)
Platelets: 198 10*3/uL (ref 150–400)
RBC: 3.89 MIL/uL (ref 3.87–5.11)
RDW: 12.9 % (ref 11.5–15.5)
WBC: 6.9 10*3/uL (ref 4.0–10.5)

## 2018-11-03 LAB — URINALYSIS, ROUTINE W REFLEX MICROSCOPIC
Bilirubin Urine: NEGATIVE
Glucose, UA: NEGATIVE mg/dL
HGB URINE DIPSTICK: NEGATIVE
Ketones, ur: NEGATIVE mg/dL
Leukocytes, UA: NEGATIVE
Nitrite: NEGATIVE
PH: 6 (ref 5.0–8.0)
Protein, ur: NEGATIVE mg/dL
SPECIFIC GRAVITY, URINE: 1.016 (ref 1.005–1.030)

## 2018-11-03 LAB — GLUCOSE, CAPILLARY: Glucose-Capillary: 125 mg/dL — ABNORMAL HIGH (ref 70–99)

## 2018-11-03 LAB — ABO/RH: ABO/RH(D): A POS

## 2018-11-03 LAB — TYPE AND SCREEN
ABO/RH(D): A POS
Antibody Screen: NEGATIVE

## 2018-11-03 LAB — AMNISURE RUPTURE OF MEMBRANE (ROM) NOT AT ARMC: Amnisure ROM: POSITIVE

## 2018-11-03 LAB — OB RESULTS CONSOLE GBS: GBS: NEGATIVE

## 2018-11-03 MED ORDER — SODIUM CHLORIDE 0.9 % IV SOLN
250.0000 mL | INTRAVENOUS | Status: DC | PRN
  Administered 2018-11-03 – 2018-11-05 (×4): 250 mL via INTRAVENOUS

## 2018-11-03 MED ORDER — SODIUM CHLORIDE 0.9 % IV SOLN
2.0000 g | Freq: Four times a day (QID) | INTRAVENOUS | Status: AC
  Administered 2018-11-03 – 2018-11-05 (×8): 2 g via INTRAVENOUS
  Filled 2018-11-03 (×8): qty 2

## 2018-11-03 MED ORDER — ZOLPIDEM TARTRATE 5 MG PO TABS: 5.0000 mg | ORAL_TABLET | Freq: Every evening | ORAL | Status: DC | PRN

## 2018-11-03 MED ORDER — BETAMETHASONE SOD PHOS & ACET 6 (3-3) MG/ML IJ SUSP
12.0000 mg | Freq: Once | INTRAMUSCULAR | Status: AC
  Administered 2018-11-03: 12 mg via INTRAMUSCULAR
  Filled 2018-11-03: qty 2

## 2018-11-03 MED ORDER — BETAMETHASONE SOD PHOS & ACET 6 (3-3) MG/ML IJ SUSP
12.0000 mg | Freq: Once | INTRAMUSCULAR | Status: AC
  Administered 2018-11-04: 12 mg via INTRAMUSCULAR
  Filled 2018-11-03: qty 2

## 2018-11-03 MED ORDER — CALCIUM CARBONATE ANTACID 500 MG PO CHEW: 2.0000 | CHEWABLE_TABLET | ORAL | Status: DC | PRN

## 2018-11-03 MED ORDER — SODIUM CHLORIDE 0.9% FLUSH
3.0000 mL | Freq: Two times a day (BID) | INTRAVENOUS | Status: DC
  Administered 2018-11-03 – 2018-11-05 (×5): 3 mL via INTRAVENOUS

## 2018-11-03 MED ORDER — AZITHROMYCIN 500 MG PO TABS
500.0000 mg | ORAL_TABLET | Freq: Every day | ORAL | Status: DC
  Administered 2018-11-04 – 2018-11-05 (×2): 500 mg via ORAL
  Filled 2018-11-03: qty 2
  Filled 2018-11-03: qty 1
  Filled 2018-11-03: qty 2

## 2018-11-03 MED ORDER — AMOXICILLIN 500 MG PO CAPS
500.0000 mg | ORAL_CAPSULE | Freq: Three times a day (TID) | ORAL | Status: DC
  Administered 2018-11-05: 500 mg via ORAL
  Filled 2018-11-03 (×3): qty 1

## 2018-11-03 MED ORDER — SODIUM CHLORIDE 0.9% FLUSH: 3.0000 mL | INTRAVENOUS | Status: DC | PRN

## 2018-11-03 MED ORDER — PRENATAL MULTIVITAMIN CH
1.0000 | ORAL_TABLET | Freq: Every day | ORAL | Status: DC
  Administered 2018-11-03 – 2018-11-05 (×4): 1 via ORAL
  Filled 2018-11-03 (×4): qty 1

## 2018-11-03 MED ORDER — ACETAMINOPHEN 325 MG PO TABS: 650.0000 mg | ORAL_TABLET | ORAL | Status: DC | PRN

## 2018-11-03 MED ORDER — DOCUSATE SODIUM 100 MG PO CAPS
100.0000 mg | ORAL_CAPSULE | Freq: Every day | ORAL | Status: DC
  Administered 2018-11-03 – 2018-11-05 (×3): 100 mg via ORAL
  Filled 2018-11-03 (×3): qty 1

## 2018-11-03 NOTE — MAU Note (Signed)
Urine in lab 

## 2018-11-03 NOTE — MAU Note (Signed)
Pt states she had a gush of fluid around 0500 and then again around 0830.  Pt states fluid looked clear at first but was more yellow on her pad.  Pt reports good fetal movement.

## 2018-11-03 NOTE — H&P (Signed)
Sabrina Rogers is a 33 y.o. G1P0 at 34.1wks who presents for pPROM.  She states she awoke this morning to a gush of fluid around 0500.  She states she had a second occurrence around 0830.  She endorses that the fluid was clear and odorless.  She denies contractions, vaginal bleeding, or sexual activity in the past 48 hours.  Patient endorses fetal movement.   Patient was supervised for a high risk pregnancy due to gestational diabetes that was diet controlled and requiring daily monitoring. Medical history significant for ASCUS with +HPV.  Patient GBS is unknown and pending collection.  BMZ to be given prior to transfer to Antepartum and then in 24 hours..      Nursing Staff Provider  Office Location  [facility] Dating  U/S  Language  English Anatomy US  Nml; needs f/u views of heart.  Flu Vaccine  07/01/18 Genetic Screen  NIPS: Normal                  [id number]:negative   TDaP vaccine   09/23/18 Hgb A1C or  GTT  Third trimester F-104 1-174 2-165  Rhogam  NA   LAB RESULTS   Feeding Plan Breast Blood Type A/RH(D) POSITIVE/-- (07/29 1400)   Contraception Considering IUD Antibody NO ANTIBODIES DETECTED (07/29 1400)  Circumcision NA Rubella 8.22 (07/29 1400)  Pediatrician  [facility] RPR NON-REACTIVE (07/29 1400)   Support Person Sabrina Rogers HBsAg NON-REACTIVE (07/29 1400)   Prenatal Classes  HIV NON-REACTIVE (07/29 1400)  BTL Consent n/a GBS    VBAC Consent n/a Pap  ASCUS + HR HPV    Hgb Electro  Sickle negative    CF NEG    SMA Declined    Waterbirth  [ ]  Class [ ]  Consent [ ]  CNM visit    Past Surgical History:  Procedure Laterality Date  . NO PAST SURGERIES     Family History: family history includes Hypertension in her father and mother; Hypothyroidism in her mother. Social History:  reports that she has never smoked. She has never used smokeless tobacco. She reports that she does not drink alcohol or use drugs.     Maternal Diabetes: Yes:  Diabetes Type:  Diet controlled Genetic Screening:  Normal Maternal Ultrasounds/Referrals: Normal Fetal Ultrasounds or other Referrals:  None Maternal Substance Abuse:  No Significant Maternal Medications:  None Significant Maternal Lab Results:  Lab values include: Other:  GBS Unknown Other Comments:  None  ROS Maternal Medical History:  Reason for admission: Rupture of membranes.   Fetal activity: Perceived fetal activity is normal.   Last perceived fetal movement was within the past hour.    Prenatal Complications - Diabetes: gestational. Diabetes is managed by diet.      Dilation: Closed Effacement (%): Thick Station: Ballotable Exam by:: Gerrit HeckJessica Kole Hilyard CNM  Blood pressure 130/90, pulse 96, temperature 98.2 F (36.8 C), resp. rate 18, height 5\' 3"  (1.6 m), last menstrual period 03/09/2018, SpO2 98 %. Maternal Exam:  Uterine Assessment: Contraction frequency is irregular.   Abdomen: Patient reports no abdominal tenderness. Fundal height is AGA.   Fetal presentation: vertex  Introitus: Normal vulva. Normal vagina.  Ferning test: positive.  Nitrazine test: not done. Amniotic fluid character: clear.  Pelvis: adequate for delivery.   Cervix: Cervix evaluated by sterile speculum exam and digital exam.     Fetal Exam Fetal Monitor Review: Baseline rate: 155.  Variability: moderate (6-25 bpm).   Pattern: accelerations present and no decelerations.  Fetal State Assessment: Category I - tracings are normal.     Physical Exam  Constitutional: She is oriented to person, place, and time. She appears well-developed and well-nourished. No distress.  HENT:  Head: Normocephalic and atraumatic.  Eyes: Conjunctivae are normal.  Neck: Normal range of motion.  Cardiovascular: Normal rate, regular rhythm and normal heart sounds.  Respiratory: Effort normal and breath sounds normal.  GI: Soft. Bowel sounds are normal. Distention: Gravid. There is no abdominal tenderness.  Genitourinary:    Vulva normal.  Cervix exhibits  discharge (Clear mucoid discharge from os. No apparent active leaking.). Cervix exhibits no motion tenderness.    Vaginal bleeding present.  There is bleeding in the vagina.    Genitourinary Comments: Sterile Speculum Exam: -Vaginal Vault: Pink mucosa.  No apparent discharge or pooling in vault. -Cervix:Pink, no lesions, cysts, or polyps.  Appears closed. No active bleeding.  Vagal with clear mucoid discharge from os-Sample for fern collected -Bimanual Exam: Closed/Long/Thick/Ballotable/Vertex Fern +, but small isolated area; Amnisure collected   Musculoskeletal: Normal range of motion.        General: No edema.  Neurological: She is alert and oriented to person, place, and time.  Skin: Skin is warm and dry.    Results for orders placed or performed during the hospital encounter of 11/03/18 (from the past 24 hour(s))  Urinalysis, Routine w reflex microscopic     Status: Abnormal   Collection Time: 11/03/18 10:50 AM  Result Value Ref Range   Color, Urine YELLOW YELLOW   APPearance HAZY (A) CLEAR   Specific Gravity, Urine 1.016 1.005 - 1.030   pH 6.0 5.0 - 8.0   Glucose, UA NEGATIVE NEGATIVE mg/dL   Hgb urine dipstick NEGATIVE NEGATIVE   Bilirubin Urine NEGATIVE NEGATIVE   Ketones, ur NEGATIVE NEGATIVE mg/dL   Protein, ur NEGATIVE NEGATIVE mg/dL   Nitrite NEGATIVE NEGATIVE   Leukocytes, UA NEGATIVE NEGATIVE  Amnisure [facility] membrane (rom[facility]     Status: None   Collection Time: 11/03/18 11:57 AM  Result Value Ref Range   Amnisure ROM POSITIVE     Prenatal labs: ABO, Rh: A/RH(D) POSITIVE/-- (07/29 1400) Antibody: NO ANTIBODIES DETECTED (07/29 1400) Rubella: 8.22 (07/29 1400) RPR: NON-REACTIVE (12/02 0830)  HBsAg: NON-REACTIVE (07/29 1400)  HIV: NON-REACTIVE (12/02 0830)  GBS:   To be collected  Assessment/Plan: IUP at 34.1wks Cat I FT pPROM  Dr. Despina HiddenEure consulted and advises: Admit to Antepartum Routine Antepartum Orders per Protocol including -Antibiotic  Therapy -Perform US for AFI, EFW, and Presentation -BMZ initiation/completion -Will collect GBS  In room to complete assessment and discuss POC: -Admission -Plan for delivery of unknown date Q/C addressed and Reassurances given  Cherre RobinsJessica L Laaibah Wartman MSN, CNM 11/03/2018, 12:35 PM

## 2018-11-03 NOTE — Consult Note (Signed)
Neonatology Consult to Antenatal Patient:  I was asked by Dr. Despina Hidden to see this patient in order to provide antenatal counseling due to prematurity.  Ms. Macher was admitted 11/03/18 at 34 1/[redacted] weeks GA for PROM. She is currently not having active labor. No bleeding or fetal distress. She is getting BMZ, IVFL, and Ampicillin.  Pregnancy complicated by GDM diet controlled,; GBS unknown.   I spoke with the patient and her sister. We discussed the worst case of delivery in the next 1-2 days, including usual DR management, possible respiratory complications and need for support, IV access, feedings (mother desires breast feeding, which was encouraged), LOS, Mortality and Morbidity, and long term outcomes. She did not have any questions at this time. I offered a NICU tour to any interested family members and would be glad to come back if she has more questions later.  Thank you for asking me to see this patient.  Dineen Kid Leary Roca, MD Neonatologist 11/03/2018, 9:37 PM  The total length of face-to-face or floor/unit time for this encounter was 25 minutes. Counseling and/or coordination of care was 40 minutes of the above.

## 2018-11-04 ENCOUNTER — Ambulatory Visit (INDEPENDENT_AMBULATORY_CARE_PROVIDER_SITE_OTHER): Payer: Medicaid Other | Admitting: Obstetrics & Gynecology

## 2018-11-04 DIAGNOSIS — O42913 Preterm premature rupture of membranes, unspecified as to length of time between rupture and onset of labor, third trimester: Principal | ICD-10-CM

## 2018-11-04 DIAGNOSIS — O24419 Gestational diabetes mellitus in pregnancy, unspecified control: Secondary | ICD-10-CM

## 2018-11-04 LAB — GLUCOSE, CAPILLARY
GLUCOSE-CAPILLARY: 115 mg/dL — AB (ref 70–99)
Glucose-Capillary: 104 mg/dL — ABNORMAL HIGH (ref 70–99)
Glucose-Capillary: 128 mg/dL — ABNORMAL HIGH (ref 70–99)
Glucose-Capillary: 139 mg/dL — ABNORMAL HIGH (ref 70–99)

## 2018-11-04 NOTE — Progress Notes (Signed)
Patient ID: Sabrina Rogers, female   DOB: [birth year], 33 y.o.   MRN: [id number] FACULTY PRACTICE ANTEPARTUM(COMPREHENSIVE) NOTE  Sabrina Rogers is a 33 y.o. G1P0 with Estimated Date of Delivery: 12/14/18   By  LMP, early ultrasound 4770w2d  who is admitted for ? PROM.    Fetal presentation is cephalic. Length of Stay:  1  Days  Date of admission:11/03/2018  Subjective: No leaking of fluid since presentation Patient reports the fetal movement as active. Patient reports uterine contraction  activity as none. Patient reports  vaginal bleeding as none. Patient describes fluid per vagina as None.  Vitals:  Blood pressure 107/66, pulse 74, temperature 97.7 F (36.5 C), temperature source Oral, resp. rate 18, height 5\' 3"  (1.6 m), weight 73.9 kg, last menstrual period 03/09/2018, SpO2 100 %. Vitals:   11/03/18 1434 11/03/18 1953 11/03/18 2214 11/04/18 0544  BP: (!) 120/101 124/76 130/72 107/66  Pulse: (!) 112 81 84 74  Resp:  17 17 18   Temp: 98.8 F (37.1 C) 98.7 F (37.1 C) 98.2 F (36.8 C) 97.7 F (36.5 C)  TempSrc: Oral Oral Oral Oral  SpO2: 98% 99% 97% 100%  Weight: 73.9 kg     Height: 5\' 3"  (1.6 m)      Physical Examination:  General appearance - alert, well appearing, and in no distress Abdomen - soft, nontender, nondistended, no masses or organomegaly Fundal Height:  size equals dates Pelvic Exam:  examination not I Membranes:presumed ROM  Fetal Monitoring:  Baseline: 140 bpm, Variability: Good {> 6 bpm), Accelerations: Reactive and Decelerations: Absent   reactive  Labs:  Results for orders placed or performed during the hospital encounter of 11/03/18 (from the past 24 hour(s))  Urinalysis, Routine w reflex microscopic   Collection Time: 11/03/18 10:50 AM  Result Value Ref Range   Color, Urine YELLOW YELLOW   APPearance HAZY (A) CLEAR   Specific Gravity, Urine 1.016 1.005 - 1.030   pH 6.0 5.0 - 8.0   Glucose, UA NEGATIVE NEGATIVE mg/dL   Hgb urine dipstick NEGATIVE  NEGATIVE   Bilirubin Urine NEGATIVE NEGATIVE   Ketones, ur NEGATIVE NEGATIVE mg/dL   Protein, ur NEGATIVE NEGATIVE mg/dL   Nitrite NEGATIVE NEGATIVE   Leukocytes, UA NEGATIVE NEGATIVE  Amnisure rupture [facility] membrane (rom)not [facility]   Collection Time: 11/03/18 11:57 AM  Result Value Ref Range   Amnisure ROM POSITIVE   CBC on admission   Collection Time: 11/03/18  1:59 PM  Result Value Ref Range   WBC 6.9 4.0 - 10.5 K/uL   RBC 3.89 3.87 - 5.11 MIL/uL   Hemoglobin 11.6 (L) 12.0 - 15.0 g/dL   HCT 40.934.7 (L) 81.136.0 - 91.446.0 %   MCV 89.2 80.0 - 100.0 fL   MCH 29.8 26.0 - 34.0 pg   MCHC 33.4 30.0 - 36.0 g/dL   RDW 78.212.9 95.611.5 - 21.315.5 %   Platelets 198 150 - 400 K/uL   nRBC 0.0 0.0 - 0.2 %  Type and screen [facility]   Collection Time: 11/03/18  1:59 PM  Result Value Ref Range   ABO/RH(D) A POS    Antibody Screen NEG    Sample Expiration      11/06/2018 Performed at [facility], [address] [place], [place] [address]   ABO/Rh   Collection Time: 11/03/18  1:59 PM  Result Value Ref Range   ABO/RH(D)      A POS Performed at [facility], [address] [place], [place] [address]  Glucose, capillary   Collection Time: 11/03/18  7:22 PM  Result Value Ref Range   Glucose-Capillary 125 (H) 70 - 99 mg/dL   Comment 1 Notify RN    Comment 2 Document in Chart    ----------------------------------------------------------------------  OBSTETRICS REPORT                       (Signed Final 11/03/2018 07:58 pm) ---------------------------------------------------------------------- Patient Info  ID #:       Rogers                          D.O.B.:  01-17-1986 (32 yrs)  Name:       Sabrina Labrador                  Visit Date: 11/03/2018 02:35 pm ---------------------------------------------------------------------- Performed By  Performed By:     [facility]        Ref. Address:      [address]                                                               [place], [place]  Attending:        Noralee Space MD        Location:          [facility]  Referred By:      [facility]                    for [facility]                    Healthcare ---------------------------------------------------------------------- Orders   #  Description                          Code         Ordered By   1  [place] MFM OB FOLLOW UP                  [id number]     JESSICA EMLY  ----------------------------------------------------------------------   #  Order #                    Accession #                 Episode #   1  [id number]                  [id number]                  [id number]  ---------------------------------------------------------------------- Indications   [redacted] weeks gestation of pregnancy                Z3A.34   Premature rupture of membranes - leaking       O42.90   fluid  ---------------------------------------------------------------------- Vital Signs  Height:        5'4" ---------------------------------------------------------------------- Fetal Evaluation  Num Of Fetuses:          1  Fetal Heart Rate(bpm):   159  Cardiac Activity:        Observed  Presentation:            Cephalic  Placenta:                Anterior  Amniotic Fluid  AFI FV:      Within normal limits  AFI Sum(cm)     %Tile       Largest Pocket(cm)  12.67           39          5.91  RUQ(cm)       RLQ(cm)       LUQ(cm)        LLQ(cm)  1.87          5.91          1.29           3.6 ---------------------------------------------------------------------- Biometry  BPD:      85.8  mm     G. Age:  34w 4d         62  %    CI:        75.51   %    70 - 86                                                          FL/HC:       21.4  %    19.4 - 21.8  HC:      313.1  mm     G. Age:  35w 1d         38  %    HC/AC:       1.04       0.96 - 1.11  AC:      302.2  mm     G. Age:  34w 2d         55  %    FL/BPD:       78.2  %    71 - 87  FL:       67.1  mm     G. Age:  34w 4d         50  %    FL/AC:       22.2  %    20 - 24  Est. FW:    2423   gm     5 lb 5 oz     65  % ---------------------------------------------------------------------- OB History  Gravidity:    1 ---------------------------------------------------------------------- Gestational Age  LMP:           34w 1d        Date:  03/09/18                 EDD:   12/14/18  U/S Today:     34w 5d                                        EDD:   12/10/18  Best:  34w 1d     Det. By:  LMP  (03/09/18)          EDD:   12/14/18 ---------------------------------------------------------------------- Anatomy  Cranium:               Appears normal         Aortic Arch:            Previously seen  Cavum:                 Appears normal         Ductal Arch:            Previously seen  Ventricles:            Appears normal         Diaphragm:              Appears normal  Choroid Plexus:        Appears normal         Stomach:                Appears normal, left                                                                        sided  Cerebellum:            Appears normal         Abdomen:                Appears normal  Posterior Fossa:       Appears normal         Abdominal Wall:         Previously seen  Nuchal Fold:           Previously seen        Cord Vessels:           Previously seen  Face:                  Orbits and profile     Kidneys:                Appear normal                         previously seen  Lips:                  Previously seen        Bladder:                Appears normal  Thoracic:              Appears normal         Spine:                  Previously seen  Heart:                 Appears normal         Upper Extremities:      Previously seen                         (4CH, axis, and situs  RVOT:  Previously seen        Lower Extremities:      Previously seen  LVOT:                  Previously  seen ---------------------------------------------------------------------- Impression  Patient is admitted with suspected PPROM.  Amniotic fluid is normal and good fetal activity is seen. Fetal  growth is appropriate for gestational age. ----------------------------------------------------------------------                  Noralee Spaceavi Shankar, MD Electronically Signed Final Report   11/03/2018 07:58 pm ----------------------------------------------------------------------    Medications:  Scheduled . [START ON 11/05/2018] amoxicillin  500 mg Oral Q8H  . azithromycin  500 mg Oral Daily  . betamethasone acetate-betamethasone sodium phosphate  12 mg Intramuscular Once  . docusate sodium  100 mg Oral Daily  . prenatal multivitamin  1 tablet Oral Q1200  . sodium chloride flush  3 mL Intravenous Q12H   I have reviewed the patient's current medications.  ASSESSMENT: G1P0 8151w2d Estimated Date of Delivery: 12/14/18  ?PPROM Patient Active Problem List   Diagnosis Date Noted  . Preterm premature rupture of membranes (PPROM) with unknown onset of labor 11/03/2018  . Abnormal glucose tolerance test (GTT) during pregnancy, antepartum 10/04/2018  . Gestational diabetes 09/24/2018  . ASCUS with positive high risk HPV cervical 05/27/2018  . Supervision of normal first pregnancy, antepartum 05/20/2018    PLAN: >not entirely sure of PPROM, the fern was in a very small area and could not be found on the slide again, there was no pooling, AFI is 12 and there have been no more gushes or leaking >recommend re eval if has fluid leakage or gush, if none repeat amniosure in am >otherwise continue antibiotics >wants to prolong the pregnancy and manage expectantly unless otherwise clinically indicated(PPROMPT trial (Morris, [employer]))  [employer] TechnologiesLuther H Yassmin Binegar 11/04/2018,7:36 AM

## 2018-11-05 ENCOUNTER — Encounter (HOSPITAL_COMMUNITY): Payer: Self-pay

## 2018-11-05 LAB — CBC
HCT: 33.1 % — ABNORMAL LOW (ref 36.0–46.0)
Hemoglobin: 10.7 g/dL — ABNORMAL LOW (ref 12.0–15.0)
MCH: 29.2 pg (ref 26.0–34.0)
MCHC: 32.3 g/dL (ref 30.0–36.0)
MCV: 90.2 fL (ref 80.0–100.0)
NRBC: 0.3 % — AB (ref 0.0–0.2)
Platelets: 178 10*3/uL (ref 150–400)
RBC: 3.67 MIL/uL — ABNORMAL LOW (ref 3.87–5.11)
RDW: 13.2 % (ref 11.5–15.5)
WBC: 8.1 10*3/uL (ref 4.0–10.5)

## 2018-11-05 LAB — GLUCOSE, CAPILLARY
Glucose-Capillary: 107 mg/dL — ABNORMAL HIGH (ref 70–99)
Glucose-Capillary: 100 mg/dL — ABNORMAL HIGH (ref 70–99)
Glucose-Capillary: 119 mg/dL — ABNORMAL HIGH (ref 70–99)
Glucose-Capillary: 104 mg/dL — ABNORMAL HIGH (ref 70–99)
Glucose-Capillary: 97 mg/dL (ref 70–99)
Glucose-Capillary: 112 mg/dL — ABNORMAL HIGH (ref 70–99)

## 2018-11-05 LAB — CULTURE, BETA STREP (GROUP B ONLY)

## 2018-11-05 MED ORDER — FLEET ENEMA 7-19 GM/118ML RE ENEM: 1.0000 | ENEMA | RECTAL | Status: DC | PRN

## 2018-11-05 MED ORDER — LACTATED RINGERS IV SOLN
500.0000 mL | INTRAVENOUS | Status: DC | PRN
  Administered 2018-11-05: 500 mL via INTRAVENOUS

## 2018-11-05 MED ORDER — SOD CITRATE-CITRIC ACID 500-334 MG/5ML PO SOLN: 30.0000 mL | ORAL | Status: DC | PRN

## 2018-11-05 MED ORDER — TERBUTALINE SULFATE 1 MG/ML IJ SOLN: 0.2500 mg | Freq: Once | INTRAMUSCULAR | Status: DC | PRN

## 2018-11-05 MED ORDER — ONDANSETRON HCL 4 MG/2ML IJ SOLN
4.0000 mg | Freq: Four times a day (QID) | INTRAMUSCULAR | Status: DC | PRN
  Administered 2018-11-06: 4 mg via INTRAVENOUS
  Filled 2018-11-05: qty 2

## 2018-11-05 MED ORDER — FENTANYL CITRATE (PF) 100 MCG/2ML IJ SOLN
100.0000 ug | INTRAMUSCULAR | Status: DC | PRN
  Administered 2018-11-05: 50 ug via INTRAVENOUS
  Administered 2018-11-06 (×2): 100 ug via INTRAVENOUS
  Administered 2018-11-06: 50 ug via INTRAVENOUS
  Administered 2018-11-06: 100 ug via INTRAVENOUS
  Filled 2018-11-05 (×4): qty 2

## 2018-11-05 MED ORDER — OXYTOCIN BOLUS FROM INFUSION: 500.0000 mL | Freq: Once | INTRAVENOUS | Status: DC

## 2018-11-05 MED ORDER — LIDOCAINE HCL (PF) 1 % IJ SOLN
30.0000 mL | INTRAMUSCULAR | Status: DC | PRN
  Filled 2018-11-05: qty 30

## 2018-11-05 MED ORDER — OXYTOCIN 40 UNITS IN NORMAL SALINE INFUSION - SIMPLE MED
2.5000 [IU]/h | INTRAVENOUS | Status: DC
  Filled 2018-11-05: qty 1000

## 2018-11-05 MED ORDER — MISOPROSTOL 25 MCG QUARTER TABLET
25.0000 ug | ORAL_TABLET | ORAL | Status: DC | PRN
  Filled 2018-11-05: qty 1

## 2018-11-05 MED ORDER — LACTATED RINGERS IV SOLN
INTRAVENOUS | Status: DC
  Administered 2018-11-05 – 2018-11-06 (×3): via INTRAVENOUS
  Administered 2018-11-06: 125 mL/h via INTRAVENOUS

## 2018-11-05 MED ORDER — MISOPROSTOL 50MCG HALF TABLET
50.0000 ug | ORAL_TABLET | ORAL | Status: DC | PRN
  Administered 2018-11-05 – 2018-11-06 (×3): 50 ug via BUCCAL
  Filled 2018-11-05 (×4): qty 1

## 2018-11-05 NOTE — Progress Notes (Signed)
This note also relates to the following rows which could not be included: CBG Lab Component - View only - Cannot attach notes to extension rows  Report called to Labor and Delivery charge nurse.

## 2018-11-05 NOTE — Progress Notes (Signed)
OB/GYN Faculty Practice: Labor Progress Note  Subjective: Late entry. Patient was transferred from antepartum this afternoon for PPROM with continued leakage of fluid and over 34 weeks. Overall feeling well, states continuing to have leakage. Feels normal fetal movement.   Objective: BP 119/79   Pulse 88   Temp 98.1 F (36.7 C) (Oral)   Resp 18   Ht 5\' 3"  (1.6 m)   Wt 73.9 kg   LMP 03/09/2018   SpO2 100%   BMI 28.87 kg/m  Gen: well-appearing, NAD, sitting up in bed Dilation: 1 Effacement (%): Thick Cervical Position: Posterior Station: Ballotable Presentation: Vertex Exam by:: Dr Earlene Plater  Assessment and Plan: 33 y.o. G1P0 [redacted]w[redacted]d here for IOL for PPROM.   Labor: Induction to start with FB and cytotec. FB placed without complication. Buccal cytotec. -- pain control: plans for epidural -- PPH Risk: medium   A1GDM: Monitor CBG q4hrs.   Fetal Well-Being: EFW 2423g (65%) at 34w1. Cephalic by sutures.  -- Category I - continuous fetal monitoring  -- GBS negative   -- BMZ x 2 -- NICU consulted and saw patient 11/03/18  Cristal Deer. Earlene Plater, DO OB/GYN Fellow, Faculty Practice  6:41 PM

## 2018-11-05 NOTE — Anesthesia Pain Management Evaluation Note (Signed)
  CRNA Pain Management Visit Note  Patient: Niani Harpring, 33 y.o., female  "Hello I am a member of the anesthesia team at [facility]. We have an anesthesia team available at all times to provide care throughout the hospital, including epidural management and anesthesia for C-section. I don't know your plan for the delivery whether it a natural birth, water birth, IV sedation, nitrous supplementation, doula or epidural, but we want to meet your pain goals."   1.Was your pain managed to your expectations on prior hospitalizations?   No prior hospitalizations  2.What is your expectation for pain management during this hospitalization?     Epidural  3.How can we help you reach that goal?   Record the patient's initial score and the patient's pain goal.   Pain: 1  Pain Goal: 7 [facility] wants you to be able to say your pain was always managed very well.  Laban Emperor 11/05/2018

## 2018-11-05 NOTE — Progress Notes (Signed)
Patient ID: Sabrina Rogers, female   DOB: Aug 06, 1986, 33 y.o.   MRN: [id number] FACULTY PRACTICE ANTEPARTUM(COMPREHENSIVE) NOTE  Sabrina Rogers is a 33 y.o. G1P0 at [redacted]w[redacted]d by best clinical estimate who is admitted for PROM.   Fetal presentation is cephalic. Length of Stay:  2  Days  Subjective: Blood stained fluid Patient reports the fetal movement as active. Patient reports uterine contraction  activity as none. Patient reports  vaginal bleeding as scant staining. Patient describes fluid per vagina as Other blood stained.  Vitals:  Blood pressure 108/72, pulse 71, temperature 98 F (36.7 C), temperature source Oral, resp. rate 16, height 5\' 3"  (1.6 m), weight 73.9 kg, last menstrual period 03/09/2018, SpO2 100 %. Physical Examination:  General appearance - alert, well appearing, and in no distress Heart - normal rate and regular rhythm Abdomen - soft, nontender, nondistended Fundal Height:  size equals dates Cervical Exam: Not evaluated. SVE pink stained pooled fluid. Extremities: extremities normal, atraumatic, no cyanosis or edema and Homans sign is negative, no sign of DVT  Membranes:ruptured, sterile speculum exam as above  Fetal Monitoring:     Fetal Heart Rate A  Mode External filed at 11/05/2018 1000  Baseline Rate (A) 155 bpm filed at 11/05/2018 1000  Variability 6-25 BPM filed at 11/05/2018 1000  Accelerations 15 x 15 filed at 11/05/2018 1000  Decelerations None filed at 11/05/2018 1000  Multiple birth? N filed at 11/04/2018 1900     Labs:  Results for orders placed or performed during the hospital encounter of 11/03/18 (from the past 24 hour(s))  Glucose, capillary   Collection Time: 11/04/18  3:44 PM  Result Value Ref Range   Glucose-Capillary 128 (H) 70 - 99 mg/dL   Comment 1 Notify RN    Comment 2 Document in Chart   Glucose, capillary   Collection Time: 11/04/18 10:10 PM  Result Value Ref Range   Glucose-Capillary 139 (H) 70 - 99 mg/dL   Comment 1 Notify RN    Glucose, capillary   Collection Time: 11/05/18  6:14 AM  Result Value Ref Range   Glucose-Capillary 107 (H) 70 - 99 mg/dL   Comment 1 Notify RN   Glucose, capillary   Collection Time: 11/05/18  7:56 AM  Result Value Ref Range   Glucose-Capillary 100 (H) 70 - 99 mg/dL    Imaging Studies:     Currently EPIC will not allow sonographic studies to automatically populate into notes.  In the meantime, copy and paste results into note or free text.  Medications:  Scheduled . amoxicillin  500 mg Oral Q8H  . azithromycin  500 mg Oral Daily  . docusate sodium  100 mg Oral Daily  . prenatal multivitamin  1 tablet Oral Q1200  . sodium chloride flush  3 mL Intravenous Q12H   I have reviewed the patient's current medications.  ASSESSMENT: Patient Active Problem List   Diagnosis Date Noted  . Preterm premature rupture of membranes (PPROM) with unknown onset of labor 11/03/2018  . Abnormal glucose tolerance test (GTT) during pregnancy, antepartum 10/04/2018  . Gestational diabetes 09/24/2018  . ASCUS with positive high risk HPV cervical 05/27/2018  . Supervision of normal first pregnancy, antepartum 05/20/2018    PLAN: Ample evidence for PPROM [redacted]w[redacted]d Admit to L&D for IOL  Scheryl Darter 11/05/2018,11:31 AM

## 2018-11-06 ENCOUNTER — Encounter (HOSPITAL_COMMUNITY): Payer: Self-pay | Admitting: *Deleted

## 2018-11-06 ENCOUNTER — Inpatient Hospital Stay (HOSPITAL_COMMUNITY): Payer: Medicaid Other | Admitting: Anesthesiology

## 2018-11-06 DIAGNOSIS — O24420 Gestational diabetes mellitus in childbirth, diet controlled: Secondary | ICD-10-CM

## 2018-11-06 DIAGNOSIS — O42113 Preterm premature rupture of membranes, onset of labor more than 24 hours following rupture, third trimester: Secondary | ICD-10-CM

## 2018-11-06 DIAGNOSIS — Z3A34 34 weeks gestation of pregnancy: Secondary | ICD-10-CM

## 2018-11-06 LAB — GLUCOSE, CAPILLARY
Glucose-Capillary: 101 mg/dL — ABNORMAL HIGH (ref 70–99)
Glucose-Capillary: 125 mg/dL — ABNORMAL HIGH (ref 70–99)
Glucose-Capillary: 65 mg/dL — ABNORMAL LOW (ref 70–99)
Glucose-Capillary: 68 mg/dL — ABNORMAL LOW (ref 70–99)
Glucose-Capillary: 77 mg/dL (ref 70–99)
Glucose-Capillary: 88 mg/dL (ref 70–99)
Glucose-Capillary: 91 mg/dL (ref 70–99)

## 2018-11-06 LAB — RPR: RPR Ser Ql: NONREACTIVE

## 2018-11-06 MED ORDER — SIMETHICONE 80 MG PO CHEW: 80.0000 mg | CHEWABLE_TABLET | ORAL | Status: DC | PRN

## 2018-11-06 MED ORDER — BENZOCAINE-MENTHOL 20-0.5 % EX AERO
1.0000 "application " | INHALATION_SPRAY | CUTANEOUS | Status: DC | PRN
  Administered 2018-11-06: 1 via TOPICAL
  Filled 2018-11-06: qty 56

## 2018-11-06 MED ORDER — PHENYLEPHRINE 40 MCG/ML (10ML) SYRINGE FOR IV PUSH (FOR BLOOD PRESSURE SUPPORT)
80.0000 ug | PREFILLED_SYRINGE | INTRAVENOUS | Status: DC | PRN
  Filled 2018-11-06 (×2): qty 10

## 2018-11-06 MED ORDER — COCONUT OIL OIL
1.0000 "application " | TOPICAL_OIL | Status: DC | PRN
  Administered 2018-11-07: 1 via TOPICAL
  Filled 2018-11-06: qty 120

## 2018-11-06 MED ORDER — TETANUS-DIPHTH-ACELL PERTUSSIS 5-2.5-18.5 LF-MCG/0.5 IM SUSP: 0.5000 mL | Freq: Once | INTRAMUSCULAR | Status: DC

## 2018-11-06 MED ORDER — OXYTOCIN 40 UNITS IN NORMAL SALINE INFUSION - SIMPLE MED
1.0000 m[IU]/min | INTRAVENOUS | Status: DC
  Administered 2018-11-06: 2 m[IU]/min via INTRAVENOUS

## 2018-11-06 MED ORDER — EPHEDRINE 5 MG/ML INJ
10.0000 mg | INTRAVENOUS | Status: DC | PRN
  Filled 2018-11-06: qty 2

## 2018-11-06 MED ORDER — PRENATAL MULTIVITAMIN CH
1.0000 | ORAL_TABLET | Freq: Every day | ORAL | Status: DC
  Administered 2018-11-07 – 2018-11-08 (×2): 1 via ORAL
  Filled 2018-11-06 (×2): qty 1

## 2018-11-06 MED ORDER — DIPHENHYDRAMINE HCL 25 MG PO CAPS: 25.0000 mg | ORAL_CAPSULE | Freq: Four times a day (QID) | ORAL | Status: DC | PRN

## 2018-11-06 MED ORDER — TERBUTALINE SULFATE 1 MG/ML IJ SOLN: 0.2500 mg | Freq: Once | INTRAMUSCULAR | Status: DC | PRN

## 2018-11-06 MED ORDER — LACTATED RINGERS IV SOLN: 500.0000 mL | Freq: Once | INTRAVENOUS | Status: DC

## 2018-11-06 MED ORDER — ACETAMINOPHEN 325 MG PO TABS: 650.0000 mg | ORAL_TABLET | ORAL | Status: DC | PRN

## 2018-11-06 MED ORDER — DIPHENHYDRAMINE HCL 50 MG/ML IJ SOLN: 12.5000 mg | INTRAMUSCULAR | Status: DC | PRN

## 2018-11-06 MED ORDER — ZOLPIDEM TARTRATE 5 MG PO TABS: 5.0000 mg | ORAL_TABLET | Freq: Every evening | ORAL | Status: DC | PRN

## 2018-11-06 MED ORDER — WITCH HAZEL-GLYCERIN EX PADS: 1.0000 "application " | MEDICATED_PAD | CUTANEOUS | Status: DC | PRN

## 2018-11-06 MED ORDER — ONDANSETRON HCL 4 MG PO TABS: 4.0000 mg | ORAL_TABLET | ORAL | Status: DC | PRN

## 2018-11-06 MED ORDER — FENTANYL 2.5 MCG/ML BUPIVACAINE 1/10 % EPIDURAL INFUSION (WH - ANES)
14.0000 mL/h | INTRAMUSCULAR | Status: DC | PRN
  Administered 2018-11-06: 14 mL/h via EPIDURAL
  Administered 2018-11-06: 12 mL/h via EPIDURAL
  Filled 2018-11-06 (×2): qty 100

## 2018-11-06 MED ORDER — IBUPROFEN 600 MG PO TABS
600.0000 mg | ORAL_TABLET | Freq: Four times a day (QID) | ORAL | Status: DC
  Administered 2018-11-07 – 2018-11-08 (×7): 600 mg via ORAL
  Filled 2018-11-06 (×7): qty 1

## 2018-11-06 MED ORDER — PHENYLEPHRINE 40 MCG/ML (10ML) SYRINGE FOR IV PUSH (FOR BLOOD PRESSURE SUPPORT)
80.0000 ug | PREFILLED_SYRINGE | INTRAVENOUS | Status: DC | PRN
  Filled 2018-11-06: qty 10

## 2018-11-06 MED ORDER — DIBUCAINE 1 % RE OINT: 1.0000 "application " | TOPICAL_OINTMENT | RECTAL | Status: DC | PRN

## 2018-11-06 MED ORDER — SENNOSIDES-DOCUSATE SODIUM 8.6-50 MG PO TABS
2.0000 | ORAL_TABLET | ORAL | Status: DC
  Administered 2018-11-07 – 2018-11-08 (×2): 2 via ORAL
  Filled 2018-11-06 (×2): qty 2

## 2018-11-06 MED ORDER — LIDOCAINE HCL (PF) 1 % IJ SOLN
INTRAMUSCULAR | Status: DC | PRN
  Administered 2018-11-06 (×2): 4 mL via EPIDURAL

## 2018-11-06 MED ORDER — ONDANSETRON HCL 4 MG/2ML IJ SOLN: 4.0000 mg | INTRAMUSCULAR | Status: DC | PRN

## 2018-11-06 NOTE — Progress Notes (Signed)
Patient ID: Sabrina Rogers, female   DOB: Nov 11, 1985, 33 y.o.   MRN: [id number] Sabrina Rogers is a 33 y.o. G1P0 at [redacted]w[redacted]d admitted for induction of labor due to PPROM.  Subjective: Doing well  Objective: BP (!) 113/59 (BP Location: Left Arm)   Pulse 72   Temp 98.2 F (36.8 C) (Oral)   Resp 18   Ht 5\' 3"  (1.6 m)   Wt 73.9 kg   LMP 03/09/2018   SpO2 100%   BMI 28.87 kg/m  No intake/output data recorded.  FHT:  FHR: 150 bpm, variability: moderate,  accelerations:  Present,  decelerations:  Absent UC:   q 2-27mins  SVE:   4/60/-3  Labs: Lab Results  Component Value Date   WBC 8.1 11/05/2018   HGB 10.7 (L) 11/05/2018   HCT 33.1 (L) 11/05/2018   MCV 90.2 11/05/2018   PLT 178 11/05/2018    Assessment / Plan: IOL d/t PPROM, s/p foley bulb and cytotec x 3, will start pitocin per protocol  Labor: s/p cervical ripening Fetal Wellbeing:  Category I Pain Control:  n/a Pre-eclampsia: n/a I/D:  N/a, GBS neg Anticipated MOD:  NSVD  Cheral Marker CNM, WHNP-BC 11/06/2018, 5:26 AM

## 2018-11-06 NOTE — Progress Notes (Addendum)
LABOR PROGRESS NOTE  Sabrina Rogers is a 33 y.o. G1P0 at [redacted]w[redacted]d  admitted for IOL for PPROM  Subjective: Doing well, feeling increased pressure   Objective: BP 103/63   Pulse 76   Temp 99.6 F (37.6 C) (Axillary)   Resp 18   Ht 5\' 3"  (1.6 m)   Wt 73.9 kg   LMP 03/09/2018   SpO2 100%   BMI 28.87 kg/m  or  Vitals:   11/06/18 1301 11/06/18 1331 11/06/18 1401 11/06/18 1410  BP: 121/67 124/72 103/63   Pulse: 97 (!) 103 76   Resp: 18 16  18   Temp:    99.6 F (37.6 C)  TempSrc:    Axillary  SpO2:      Weight:      Height:        1430 SVE Dilation: 5 Effacement (%): 80 Cervical Position: Middle Station: -2 Presentation: Vertex Exam by:: Earlene Plater, RN  FHT: baseline rate 155, moderate varibility, accel present, no decel Toco: 2-5 minutes lasting 60seconds Pit: 30mu/min  Labs: Lab Results  Component Value Date   WBC 8.1 11/05/2018   HGB 10.7 (L) 11/05/2018   HCT 33.1 (L) 11/05/2018   MCV 90.2 11/05/2018   PLT 178 11/05/2018   CBG (last 3)  Recent Labs    11/06/18 0817 11/06/18 0847 11/06/18 1202  GLUCAP 65* 88 77   Patient Active Problem List   Diagnosis Date Noted  . Preterm premature rupture of membranes (PPROM) with unknown onset of labor 11/03/2018  . Abnormal glucose tolerance test (GTT) during pregnancy, antepartum 10/04/2018  . Gestational diabetes 09/24/2018  . ASCUS with positive high risk HPV cervical 05/27/2018  . Supervision of normal first pregnancy, antepartum 05/20/2018    Assessment / Plan: 33 y.o. G1P0 at [redacted]w[redacted]d here for IOL for PPROM  A1gdm: CBG wnl, continue cbg q4 hours Labor:  no cervical dilation in >4 hours, IUPC placed. Continue to titrate pitocin to achieve adequate MVU's  Fetal Wellbeing:  Category 1 Pain Control:  Epidural  Anticipated MOD:  SVD  Stephenia Deloglos, SNM 11/06/2018, 2:36 PM   IUPC placed with CNM supervision  Titrate pitocin to adequate MVUs  Plans SVD   Sharyon Cable, CNM 11/06/18, 2:52 PM

## 2018-11-06 NOTE — Anesthesia Procedure Notes (Signed)
Epidural Patient location during procedure: OB Start time: 11/06/2018 8:22 AM End time: 11/06/2018 8:30 AM  Staffing Anesthesiologist: Mal Amabile, MD Performed: anesthesiologist   Preanesthetic Checklist Completed: patient identified, site marked, surgical consent, pre-op evaluation, timeout performed, IV checked, risks and benefits discussed and monitors and equipment checked  Epidural Patient position: sitting Prep: site prepped and draped and DuraPrep Patient monitoring: continuous pulse ox and blood pressure Approach: midline Location: L3-L4 Injection technique: LOR air  Needle:  Needle type: Tuohy  Needle gauge: 17 G Needle length: 9 cm and 9 Needle insertion depth: 5 cm cm Catheter type: closed end flexible Catheter size: 19 Gauge Catheter at skin depth: 10 cm Test dose: negative and Other  Assessment Events: blood not aspirated, injection not painful, no injection resistance, negative IV test and no paresthesia  Additional Notes Patient identified. Risks and benefits discussed including failed block, incomplete  Pain control, post dural puncture headache, nerve damage, paralysis, blood pressure Changes, nausea, vomiting, reactions to medications-both toxic and allergic and post Partum back pain. All questions were answered. Patient expressed understanding and wished to proceed. Sterile technique was used throughout procedure. Epidural site was Dressed with sterile barrier dressing. No paresthesias, signs of intravascular injection Or signs of intrathecal spread were encountered.  Patient was more comfortable after the epidural was dosed. Please see RN's note for documentation of vital signs and FHR which are stable.

## 2018-11-06 NOTE — Discharge Summary (Signed)
Postpartum Discharge Summary     Patient Name: Dakia Schifano DOB: June 28, 1986 MRN: [id number]  Date of admission: 11/03/2018 Delivering Provider: Lajean Manes   Date of discharge: 11/08/2018  Admitting diagnosis: 31 WKS LEAKING FLUID Intrauterine pregnancy: [redacted]w[redacted]d    Secondary diagnosis:  Active Problems:   Gestational diabetes   Preterm premature rupture of membranes (PPROM) with unknown onset of labor   SVD (spontaneous vaginal delivery)  Additional problems: none     Discharge diagnosis: Preterm Pregnancy Delivered and GDM A1                                                                                                Post partum procedures:none  Augmentation: Pitocin, Cytotec and Foley Balloon  Complications: RJOA>41hours  Hospital course:  Induction of Labor With Vaginal Delivery   33y.o. yo G1P0 at 32w4das admitted to the hospital 11/03/2018 for induction of labor.  Indication for induction: PPROM.  Patient had an uncomplicated labor course as follows: Membrane Rupture Time/Date: 5:30 AM ,11/03/2018   Intrapartum Procedures: Episiotomy: None [1]                                         Lacerations:  1st degree [2]  Patient had delivery of a Viable infant.  Information for the patient's newborn:  HeLilyahna, Sirmon0[[id number]]Delivery Method: Vaginal, Spontaneous(Filed from Delivery Summary)   11/06/2018  Details of delivery can be found in separate delivery note.  Patient had a routine postpartum course. Patient is discharged home 11/08/18.  Magnesium Sulfate recieved: No BMZ received: Yes  Physical exam  Vitals:   11/07/18 0856 11/07/18 1731 11/07/18 1931 11/08/18 0459  BP: 100/66 105/86 112/85 103/65  Pulse: (!) 59 81 75 (!) 54  Resp: 18 18 18 18   Temp: 98 F (36.7 C) 99.5 F (37.5 C) 98.6 F (37 C) 97.8 F (36.6 C)  TempSrc: Oral Oral Oral Oral  SpO2: 100% 100% 100% 100%  Weight:      Height:       General: alert, cooperative and no  distress Lochia: appropriate Uterine Fundus: firm Incision: N/A DVT Evaluation: No evidence of DVT seen on physical exam. Labs: Lab Results  Component Value Date   WBC 8.1 11/05/2018   HGB 10.7 (L) 11/05/2018   HCT 33.1 (L) 11/05/2018   MCV 90.2 11/05/2018   PLT 178 11/05/2018   No flowsheet data found.  Discharge instruction: per After Visit Summary and "Baby and Me Booklet".  After visit meds:  Allergies as of 11/08/2018   No Known Allergies     Medication List    STOP taking these medications   ACCU-CHEK FASTCLIX LANCETS Misc   ACCU-CHEK GUIDE ME w/Device Kit   glucose blood test strip Commonly known as:  ACCU-CHEK ACTIVE STRIPS     TAKE these medications   ibuprofen 600 MG tablet Commonly known as:  ADVIL,MOTRIN Take 1 tablet (600 mg total) by mouth every 6 (six) hours.  PRENATAL VITAMINS PO Take 1 tablet by mouth at bedtime.       Diet: carb modified diet  Activity: Advance as tolerated. Pelvic rest for 6 weeks.   Outpatient follow up:6 weeks Follow up Appt: Future Appointments  Date Time Provider [place]  12/16/2018  8:15 AM Emily Filbert, MD [facility]   Follow up Visit:   Please schedule this patient for Postpartum visit in: 6 weeks with the following provider: MD For C/S patients schedule nurse incision check in weeks 2 weeks: no High risk pregnancy complicated by: GDM Delivery mode:  SVD Anticipated Birth Control:  other/unsure PP Procedures needed: Colpo and 2hr GTT  Schedule Integrated BH visit: no   Newborn Data: Live born female  Birth Weight: 5 lb 5.7 oz (2430 g) APGAR: 8, 9  Newborn Delivery   Birth date/time:  11/06/2018 18:39:00 Delivery type:  Vaginal, Spontaneous     Baby Feeding: Breast Disposition:NICU   11/08/2018 Emeterio Reeve, MD

## 2018-11-06 NOTE — Consult Note (Signed)
Neonatology Note:   Attendance at Delivery:    I was asked by V. Aundria Rud, CNM to attend this vaginal delivery at 34 4/[redacted] weeks EGA. The mother is a G1, GBS neg with good prenatal care complicated by PPROM since 11/03/18, and GDM (diet controlled). Fluid clear; no chorio concerns. Infant vigorous with good spontaneous cry and tone. +60 sec DCC. Needed only minimal bulb suctioning. Ap 8/9. Lungs clear to ausc in DR. Parents updated and father accompanied [place] to NICU.   Dineen Kid Leary Roca, MD

## 2018-11-06 NOTE — Anesthesia Preprocedure Evaluation (Signed)
Anesthesia Evaluation  Patient identified by MRN, date of birth, ID band Patient awake    Reviewed: Allergy & Precautions, Patient's Chart, lab work & pertinent test results  Airway Mallampati: II  TM Distance: >3 FB Neck ROM: Full    Dental no notable dental hx. (+) Teeth Intact   Pulmonary neg pulmonary ROS,    Pulmonary exam normal breath sounds clear to auscultation       Cardiovascular negative cardio ROS Normal cardiovascular exam Rhythm:Regular Rate:Normal     Neuro/Psych negative neurological ROS  negative psych ROS   GI/Hepatic negative GI ROS, Neg liver ROS,   Endo/Other  diabetes, Well Controlled, Gestational  Renal/GU negative Renal ROS  negative genitourinary   Musculoskeletal negative musculoskeletal ROS (+)   Abdominal   Peds  Hematology  (+) anemia ,   Anesthesia Other Findings   Reproductive/Obstetrics (+) Pregnancy                             Anesthesia Physical Anesthesia Plan  ASA: II  Anesthesia Plan: Epidural   Post-op Pain Management:    Induction:   PONV Risk Score and Plan:   Airway Management Planned: Natural Airway  Additional Equipment:   Intra-op Plan:   Post-operative Plan:   Informed Consent: I have reviewed the patients History and Physical, chart, labs and discussed the procedure including the risks, benefits and alternatives for the proposed anesthesia with the patient or authorized representative who has indicated his/her understanding and acceptance.       Plan Discussed with: Anesthesiologist  Anesthesia Plan Comments:         Anesthesia Quick Evaluation

## 2018-11-06 NOTE — Progress Notes (Addendum)
LABOR PROGRESS NOTE  Sabrina Rogers is a 33 y.o. G1P0 at [redacted]w[redacted]d  admitted for IOL for PPROM on 1/12. S/p BMZ X2   Subjective: Doing well, resting comfortably with epidural   Objective: BP (!) 103/57   Pulse 63   Temp 98.2 F (36.8 C) (Oral)   Resp 16   Ht 5\' 3"  (1.6 m)   Wt 73.9 kg   LMP 03/09/2018   SpO2 100%   BMI 28.87 kg/m  or  Vitals:   11/06/18 0901 11/06/18 0931 11/06/18 1001 11/06/18 1031  BP: 118/77 110/70 122/64 (!) 103/57  Pulse: 74 60 68 63  Resp: 16     Temp:      TempSrc:      SpO2:      Weight:      Height:        SVE 0900: Dilation: 5 Effacement (%): 60 Cervical Position: Middle Station: -3 Presentation: Vertex Exam by:: Earlene Plater, RN  FHT: baseline rate 150, moderate varibility, accel present, no decel Toco: every 2-4 minutes lasting 60 seconds  Pitocin: 49mu/min   Labs: Lab Results  Component Value Date   WBC 8.1 11/05/2018   HGB 10.7 (L) 11/05/2018   HCT 33.1 (L) 11/05/2018   MCV 90.2 11/05/2018   PLT 178 11/05/2018   CBG (last 3)  Recent Labs    11/06/18 0400 11/06/18 0817 11/06/18 0847  GLUCAP 101* 65* 88   Patient Active Problem List   Diagnosis Date Noted  . Preterm premature rupture of membranes (PPROM) with unknown onset of labor 11/03/2018  . Abnormal glucose tolerance test (GTT) during pregnancy, antepartum 10/04/2018  . Gestational diabetes 09/24/2018  . ASCUS with positive high risk HPV cervical 05/27/2018  . Supervision of normal first pregnancy, antepartum 05/20/2018    Assessment / Plan: 33 y.o. G1P0 at [redacted]w[redacted]d here for IOL due to PPROM.   A1GDM: CBG wnl, continue to monitor Q4 hours  Labor: s/p cervical foley and cytotec X3. on pitocin. Progressing as expected.  GBS negative Fetal Wellbeing:  Category 1 Pain Control:  Epidural  Anticipated MOD:  SVD  Stephenia Deloglos, SNM  11/06/2018, 0800  Continue to titrate pitocin until active labor, possible IUPC placement if no cervical change  Plans vaginal delivery    Sharyon Cable, CNM 11/06/18, 11:43 AM

## 2018-11-06 NOTE — Progress Notes (Signed)
LABOR PROGRESS NOTE  Sabrina Rogers is a 33 y.o. G1P0 at [redacted]w[redacted]d  admitted for IOL s/p PPROM.   Subjective: Doing well, no complaints   Objective: BP (!) 109/57   Pulse 70   Temp 98.2 F (36.8 C) (Oral)   Resp 18   Ht 5\' 3"  (1.6 m)   Wt 73.9 kg   LMP 03/09/2018   SpO2 100%   BMI 28.87 kg/m  or  Vitals:   11/06/18 1001 11/06/18 1031 11/06/18 1101 11/06/18 1131  BP: 122/64 (!) 103/57 (!) 107/58 (!) 109/57  Pulse: 68 63 68 70  Resp:   18   Temp:      TempSrc:      SpO2:      Weight:      Height:        SVE 0900 Dilation: 5 Effacement (%): 60 Cervical Position: Middle Station: -3 Presentation: Vertex Exam by:: Earlene Plater, RN  FHT: baseline rate 155, moderate varibility, accel present, no decel Toco: every 2-3 minutes lasting 50-60 seconds Pitocin: 10 mu/min   Labs: Lab Results  Component Value Date   WBC 8.1 11/05/2018   HGB 10.7 (L) 11/05/2018   HCT 33.1 (L) 11/05/2018   MCV 90.2 11/05/2018   PLT 178 11/05/2018   CBG (last 3)  Recent Labs    11/06/18 0400 11/06/18 0817 11/06/18 0847  GLUCAP 101* 65* 88   Patient Active Problem List   Diagnosis Date Noted  . Preterm premature rupture of membranes (PPROM) with unknown onset of labor 11/03/2018  . Abnormal glucose tolerance test (GTT) during pregnancy, antepartum 10/04/2018  . Gestational diabetes 09/24/2018  . ASCUS with positive high risk HPV cervical 05/27/2018  . Supervision of normal first pregnancy, antepartum 05/20/2018    Assessment / Plan: 33 y.o. G1P0 at [redacted]w[redacted]d here for IOL for PPROM.   A1GDM: cbg wnl, continue to monitor Q4 hours  Labor: on pitocin, continue to titrate until active labor. Consider IUPC if no cervical change Fetal Wellbeing:  Category 1 Pain Control:  epidural Anticipated MOD:  SVD  Michole Lecuyer, SNM 11/06/2018, 11:37 AM

## 2018-11-06 NOTE — Progress Notes (Signed)
Patient ID: Sabrina Rogers, female   DOB: Mar 30, 1986, 33 y.o.   MRN: [id number] Sabrina Rogers is a 33 y.o. G1P0 at [redacted]w[redacted]d admitted for induction of labor due to PPROM, A1DM.  Subjective: Doing well, no complaints  Objective: BP 119/73   Pulse 80   Temp 98 F (36.7 C) (Oral)   Resp 18   Ht 5\' 3"  (1.6 m)   Wt 73.9 kg   LMP 03/09/2018   SpO2 100%   BMI 28.87 kg/m  No intake/output data recorded.  FHT:  FHR: 150 bpm, variability: moderate,  accelerations:  Present,  decelerations:  Absent UC:   q 2-68mins  SVE:   Dilation: 4 Effacement (%): 30 Station: Ballotable Exam by:: Chukwuem[place], S. RNC   Labs: Lab Results  Component Value Date   WBC 8.1 11/05/2018   HGB 10.7 (L) 11/05/2018   HCT 33.1 (L) 11/05/2018   MCV 90.2 11/05/2018   PLT 178 11/05/2018    Assessment / Plan: IOL d/t PPROM, s/p foley bulb and cytotec x2, will do one more dose of cytotec  Labor: cervical ripening Fetal Wellbeing:  Category I Pain Control:  none Pre-eclampsia: n/a I/D:  none Anticipated MOD:  NSVD  Cheral Marker CNM, WHNP-BC 11/06/2018, 1:16 AM

## 2018-11-07 ENCOUNTER — Telehealth: Payer: Self-pay | Admitting: *Deleted

## 2018-11-07 ENCOUNTER — Encounter (HOSPITAL_COMMUNITY): Payer: Self-pay

## 2018-11-07 NOTE — Plan of Care (Signed)
  Problem: Education: Goal: Knowledge of condition will improve Outcome: Progressing   

## 2018-11-07 NOTE — Telephone Encounter (Signed)
Left patient a message to call and schedule 6 week Postpartum, Colpo, and GTT.

## 2018-11-07 NOTE — Lactation Note (Signed)
This note was copied from a baby's chart. Lactation Consultation Note; Initial visit with this mom of NICU baby born at 79 w 4 d now 30 hours old. Mom has pumped twice but did sleep through the night. Reports she was very tired. Encouraged to pump 8 times/24 hours. Reviewed hand expression with mom. Reviewed pump setup, use and cleaning of pump pieces. Has Medela pump for home. No questions at present. NICU booklet and BF brochure given. Reviewed our phone number. Encouraged to call for assist when baby is ready to latch.    Patient Name: Sabrina Rogers HQION'G Date: 11/07/2018 Reason for consult: Initial assessment;NICU baby;Late-preterm 34-36.6wks   Maternal Data Formula Feeding for Exclusion: Yes Reason for exclusion: Admission to Intensive Care Unit (ICU) post-partum;Mother's choice to formula and breast feed on admission Has patient been taught Hand Expression?: Yes Does the patient have breastfeeding experience prior to this delivery?: No  Feeding Feeding Type: Donor Breast Milk Nipple Type: Nfant Extra Slow Flow (gold)  LATCH Score                   Interventions    Lactation Tools Discussed/Used WIC Program: No Pump Review: Setup, frequency, and cleaning Initiated by:: RN Date initiated:: 11/06/18   Consult Status Consult Status: Follow-up Date: 11/01/18 Follow-up type: In-patient    Sabrina Rogers 11/07/2018, 8:51 AM

## 2018-11-07 NOTE — Progress Notes (Signed)
Post Partum Day 1 Subjective: no complaints  Objective: Blood pressure 100/66, pulse (!) 59, temperature 98 F (36.7 C), temperature source Oral, resp. rate 18, height 5\' 3"  (1.6 m), weight 73.9 kg, last menstrual period 03/09/2018, SpO2 100 %, unknown if currently breastfeeding.  Physical Exam:  General: alert, cooperative and no distress Lochia: appropriate Uterine Fundus: firm Incision: n/a DVT Evaluation: No evidence of DVT seen on physical exam.  Recent Labs    11/05/18 1952  HGB 10.7*  HCT 33.1*    Assessment/Plan: Plan for discharge tomorrow and Breastfeeding   LOS: 4 days   Scheryl Darter 11/07/2018, 1:26 PM

## 2018-11-07 NOTE — Anesthesia Postprocedure Evaluation (Signed)
Anesthesia Post Note  Patient: Sabrina Rogers  Procedure(s) Performed: AN AD HOC LABOR EPIDURAL     Patient location during evaluation: [facility] Anesthesia Type: Epidural Level of consciousness: awake and alert Pain management: pain level controlled Vital Signs Assessment: post-procedure vital signs reviewed and stable Respiratory status: spontaneous breathing, nonlabored ventilation and respiratory function stable Cardiovascular status: stable Postop Assessment: no headache, no backache, epidural receding, no apparent nausea or vomiting, patient able to bend at knees, able to ambulate and adequate PO intake Anesthetic complications: no    Last Vitals:  Vitals:   11/07/18 0347 11/07/18 0856  BP: 138/83 100/66  Pulse: 72 (!) 59  Resp: 18 18  Temp: 37.1 C 36.7 C  SpO2: 100% 100%    Last Pain:  Vitals:   11/07/18 0856  TempSrc: Oral  PainSc:    Pain Goal: Patients Stated Pain Goal: 7 (11/05/18 1641)              @ANFLOW60MIN (12500)  )Laban Emperor

## 2018-11-08 MED ORDER — IBUPROFEN 600 MG PO TABS: 600.0000 mg | ORAL_TABLET | Freq: Four times a day (QID) | ORAL | 0 refills | Status: DC

## 2018-11-08 NOTE — Lactation Note (Signed)
This note was copied from a baby's chart. Lactation Consultation Note  Patient Name: Sabrina Rogers [id number] Date: 11/08/2018   Baby in NICU [redacted]w[redacted]d.  Mother states she has been pumping q 4-5 hours with no drops. Encouraged her to hand express before and after pumping and use hands on pumping. Referred to hands on pumping video. Mother has DEBP at home.  Discussed using pumping rooms after holding baby and engorgement care.       Maternal Data    Feeding Feeding Type: Donor Breast Milk Nipple Type: Nfant Extra Slow Flow (gold)  LATCH Score                   Interventions    Lactation Tools Discussed/Used     Consult Status      [name] 11/08/2018, 2:13 PM

## 2018-11-08 NOTE — Progress Notes (Signed)
Discharge instruction given, questions answered. Pt states understanding. Given copy of instructions

## 2018-12-16 ENCOUNTER — Ambulatory Visit (INDEPENDENT_AMBULATORY_CARE_PROVIDER_SITE_OTHER): Payer: Medicaid Other | Admitting: Obstetrics & Gynecology

## 2018-12-16 ENCOUNTER — Other Ambulatory Visit (HOSPITAL_COMMUNITY)
Admission: RE | Admit: 2018-12-16 | Discharge: 2018-12-16 | Disposition: A | Discharge status: Home / Self Care | Payer: Medicaid Other | Source: Ambulatory Visit | Attending: Obstetrics & Gynecology | Admitting: Obstetrics & Gynecology

## 2018-12-16 ENCOUNTER — Encounter: Payer: Self-pay | Admitting: Obstetrics & Gynecology

## 2018-12-16 VITALS — BP 125/88 | HR 67 | Resp 16 | Ht 63.0 in | Wt 151.0 lb

## 2018-12-16 DIAGNOSIS — N898 Other specified noninflammatory disorders of vagina: Secondary | ICD-10-CM | POA: Diagnosis present

## 2018-12-16 DIAGNOSIS — Z1389 Encounter for screening for other disorder: Secondary | ICD-10-CM | POA: Diagnosis not present

## 2018-12-16 DIAGNOSIS — Z3202 Encounter for pregnancy test, result negative: Secondary | ICD-10-CM | POA: Diagnosis not present

## 2018-12-16 DIAGNOSIS — O24419 Gestational diabetes mellitus in pregnancy, unspecified control: Secondary | ICD-10-CM

## 2018-12-16 DIAGNOSIS — N870 Mild cervical dysplasia: Secondary | ICD-10-CM

## 2018-12-16 DIAGNOSIS — R87610 Atypical squamous cells of undetermined significance on cytologic smear of cervix (ASC-US): Secondary | ICD-10-CM

## 2018-12-16 DIAGNOSIS — R87810 Cervical high risk human papillomavirus (HPV) DNA test positive: Secondary | ICD-10-CM

## 2018-12-16 LAB — POCT URINE PREGNANCY: Preg Test, Ur: NEGATIVE

## 2018-12-16 NOTE — Progress Notes (Signed)
Post Partum Exam  Sabrina Rogers is a 33 y.o. G21P0101 female who presents for a postpartum visit. She is 5 weeks postpartum following a spontaneous vaginal delivery after PROM.  I have fully reviewed the prenatal and intrapartum course. The delivery was at [redacted]w[redacted]d gestational weeks.  Anesthesia: epidural. Postpartum course has been unremarkable. Baby's course has been unremarkable. Baby is feeding by both breast and bottle - Neosure [place]. Bleeding no bleeding. Bowel function is normal. Bladder function is normal. Patient is not sexually active. Contraception method is abstinence until now. She wants another baby in about a year. She is interested in getting an IUD until then.  Postpartum depression screening:neg  The following portions of the patient's history were reviewed and updated as appropriate: allergies, current medications, past family history, past medical history, past social history, past surgical history and problem list. Last pap smear was Abnormal- ASCUS + HR HPV  Review of Systems Pertinent items are noted in HPI.    Objective:  Blood pressure 125/88, pulse 67, resp. rate 16, height 5\' 3"  (1.6 m), weight 151 lb (68.5 kg), last menstrual period 03/09/2018, currently breastfeeding.  General:  alert   Breasts:  inspection negative, no nipple discharge or bleeding, no masses or nodularity palpable  Lungs: clear to auscultation bilaterally  Heart:  regular rate and rhythm, S1, S2 normal, no murmur, click, rub or gallop  Abdomen: soft, non-tender; bowel sounds normal; no masses,  no organomegaly   Vulva:  normal  Vagina: normal vagina  Cervix:  anteverted  Corpus: not examined  Adnexa:  not evaluated  Rectal Exam: Not performed.       UPT negative, consent signed, time out done Cervix prepped with acetic acid. Transformation zone seen in its entirety. Colpo adequate.  She has some mosacism scattered around the ectocervix. She has an ectropion. I biopsied at the 1 and 5 o'clock  positions. Silver nitrate achieved hemostasis. ECC obtained. She tolerated the procedure well.   Assessment:   Normal postpartum exam.  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

## 2018-12-17 LAB — 2HR GTT W 1 HR, CARPENTER, 75 G
Glucose, 1 Hr, Gest: 95 mg/dL (ref 65–179)
Glucose, 2 Hr, Gest: 112 mg/dL (ref 65–152)
Glucose, Fasting, Gest: 92 mg/dL — ABNORMAL HIGH (ref 65–91)

## 2018-12-18 LAB — CERVICOVAGINAL ANCILLARY ONLY
Bacterial vaginitis: NEGATIVE
Candida vaginitis: NEGATIVE

## 2019-01-01 ENCOUNTER — Other Ambulatory Visit: Payer: Self-pay

## 2019-01-01 ENCOUNTER — Encounter: Payer: Self-pay | Admitting: Obstetrics and Gynecology

## 2019-01-01 ENCOUNTER — Ambulatory Visit: Payer: Medicaid Other | Admitting: Obstetrics and Gynecology

## 2019-01-01 VITALS — BP 115/76 | HR 85 | Resp 16 | Ht 64.0 in | Wt 154.0 lb

## 2019-01-01 DIAGNOSIS — Z3202 Encounter for pregnancy test, result negative: Secondary | ICD-10-CM

## 2019-01-01 DIAGNOSIS — Z30430 Encounter for insertion of intrauterine contraceptive device: Secondary | ICD-10-CM | POA: Diagnosis not present

## 2019-01-01 LAB — POCT URINE PREGNANCY: Preg Test, Ur: NEGATIVE

## 2019-01-01 MED ORDER — LEVONORGESTREL 19.5 MCG/DAY IU IUD
INTRAUTERINE_SYSTEM | Freq: Once | INTRAUTERINE | Status: AC
  Administered 2019-01-01: 11:00:00 via INTRAUTERINE

## 2019-01-01 NOTE — Progress Notes (Signed)
   GYNECOLOGY CLINIC PROCEDURE NOTE  Sabrina Rogers is a 33 y.o. G1P0101 here for lyletta IUD insertion. No GYN concerns.  Last pap smear was on 2/24 and showed CIN 1 on colpo. .  IUD Insertion Procedure Note Patient identified, informed consent performed, consent signed.   Discussed risks of irregular bleeding, cramping, infection, malpositioning or misplacement of the IUD outside the uterus which may require further procedure such as laparoscopy. Time out was performed.  Urine pregnancy test negative.  Speculum placed in the vagina.  Cervix visualized.  Cleaned with Betadine x 2.  Grasped anteriorly with a single tooth tenaculum.  Uterus sounded to 6 cm.  Mirena IUD placed per manufacturer's recommendations.  Strings trimmed to 3 cm. Tenaculum was removed, good hemostasis noted.  Patient tolerated procedure well.   Patient was given post-procedure instructions.  She was advised to have backup contraception for one week.  Patient was also asked to check IUD strings periodically and follow up in 4 weeks for IUD check.   Venia Carbon I, NP 01/01/2019 11:07 AM

## 2019-01-01 NOTE — Patient Instructions (Signed)

## 2019-01-03 ENCOUNTER — Ambulatory Visit: Payer: Medicaid Other

## 2019-01-03 ENCOUNTER — Other Ambulatory Visit: Payer: Self-pay

## 2019-01-03 VITALS — BP 110/70 | HR 82 | Ht 64.0 in | Wt 154.0 lb

## 2019-01-03 DIAGNOSIS — Z30430 Encounter for insertion of intrauterine contraceptive device: Secondary | ICD-10-CM | POA: Diagnosis not present

## 2019-01-03 DIAGNOSIS — Z30431 Encounter for routine checking of intrauterine contraceptive device: Secondary | ICD-10-CM

## 2019-01-03 DIAGNOSIS — Z30432 Encounter for removal of intrauterine contraceptive device: Secondary | ICD-10-CM | POA: Diagnosis not present

## 2019-01-03 MED ORDER — LEVONORGESTREL 19.5 MCG/DAY IU IUD
INTRAUTERINE_SYSTEM | Freq: Once | INTRAUTERINE | Status: AC
  Administered 2019-01-03: 12:00:00 via INTRAUTERINE

## 2019-01-03 NOTE — Addendum Note (Signed)
Addended by: Kathie Dike on: 01/03/2019 11:31 AM   Modules accepted: Orders

## 2019-01-03 NOTE — Progress Notes (Signed)
    GYNECOLOGY OFFICE PROCEDURE NOTE  Sabrina Rogers is a 33 y.o. G1P0101 here for Liletta IUD check. She had it inserted on Wednesday 3/11 and feels like it is in her vagina. No GYN concerns.    Sterile speculum exam shows IUD in vagina. Patient desires IUD to be replaced.   IUD Insertion Procedure Note Patient identified, informed consent performed, consent signed.   Discussed risks of irregular bleeding, cramping, infection, malpositioning or misplacement of the IUD outside the uterus which may require further procedure such as laparoscopy. Time out was performed.  Urine pregnancy test negative.  Speculum placed in the vagina.  Cervix visualized.  Cleaned with Betadine x 2.  Grasped anteriorly with a single tooth tenaculum.  Uterus sounded to 7 cm.  Liletta IUD placed per manufacturer's recommendations.  Strings trimmed to 3 cm. Tenaculum was removed, good hemostasis noted.  Patient tolerated procedure well.   Patient was given post-procedure instructions.  She was advised to have backup contraception for one week.  Patient was also asked to check IUD strings periodically and follow up in 4 weeks for IUD check.  Rolm Bookbinder, CNM 01/03/19 11:29 AM

## 2019-01-29 ENCOUNTER — Ambulatory Visit: Payer: Medicaid Other | Admitting: Obstetrics and Gynecology

## 2019-06-24 ENCOUNTER — Ambulatory Visit: Payer: Medicaid Other | Admitting: Certified Nurse Midwife

## 2019-07-01 ENCOUNTER — Encounter: Payer: Self-pay | Admitting: Advanced Practice Midwife

## 2019-07-01 ENCOUNTER — Other Ambulatory Visit: Payer: Self-pay

## 2019-07-01 ENCOUNTER — Ambulatory Visit (INDEPENDENT_AMBULATORY_CARE_PROVIDER_SITE_OTHER): Payer: Medicaid Other | Admitting: Advanced Practice Midwife

## 2019-07-01 VITALS — BP 123/72 | HR 62 | Resp 16 | Ht 64.0 in | Wt 164.0 lb

## 2019-07-01 DIAGNOSIS — Z8751 Personal history of pre-term labor: Secondary | ICD-10-CM | POA: Diagnosis not present

## 2019-07-01 DIAGNOSIS — R87810 Cervical high risk human papillomavirus (HPV) DNA test positive: Secondary | ICD-10-CM

## 2019-07-01 DIAGNOSIS — Z30431 Encounter for routine checking of intrauterine contraceptive device: Secondary | ICD-10-CM | POA: Diagnosis not present

## 2019-07-01 DIAGNOSIS — Z975 Presence of (intrauterine) contraceptive device: Secondary | ICD-10-CM | POA: Insufficient documentation

## 2019-07-01 DIAGNOSIS — R87610 Atypical squamous cells of undetermined significance on cytologic smear of cervix (ASC-US): Secondary | ICD-10-CM | POA: Diagnosis not present

## 2019-07-01 NOTE — Progress Notes (Signed)
  GYNECOLOGY CLINIC PROGRESS NOTE  History:  33 y.o. G1P0101 here today for today for IUD string check; Lileta IUD was placed  12/16/18. No complaints about the Liletta, no concerning side effects.  The following portions of the patient's history were reviewed and updated as appropriate: allergies, current medications, past family history, past medical history, past social history, past surgical history and problem list. Last pap smear on 04/2018 was abnormal, Colposcopy 12/16/18 with ASCUS positive HRHPV.   Review of Systems:  Pertinent items are noted in HPI.   Objective:  Physical Exam Blood pressure 123/72, pulse 62, resp. rate 16, height 5\' 4"  (1.626 m), weight 164 lb (74.4 kg), not currently breastfeeding. Gen: NAD Abd: Soft, nontender and nondistended Pelvic: Normal appearing external genitalia; normal appearing vaginal mucosa and cervix.  IUD strings visualized, about 3 cm in length outside cervix.   Assessment & Plan:  1. Atypical squamous cells of undetermined significance on cytologic smear of cervix (ASC-US) --Colpo 12/16/18 with ASCUS positive HPV --Repeat Pap with cotesting in 12 months per ASCCP guidelines  2. IUD check up --Doing well. No concerns. Plans to take IUD out before 5 years to plan her next pregnancy.  Will call office to schedule as desired.  Patient to keep IUD in place for five years; can come in for removal if she desires pregnancy within the next five years. Routine preventative health maintenance measures emphasized.  Sophiya Blank, CNM 3:24 PM

## 2020-01-26 IMAGING — US US MFM OB COMP +14 WKS
1 series · 14 of 28 positions shown · non-contrast
Comparison: none

[Series 1: us mfm ob comp +14 wks · 14 of 106 slices shown]
[im 4/106]
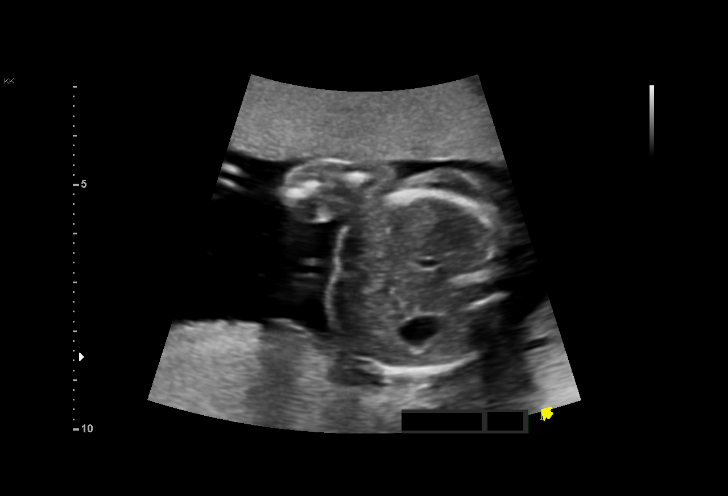
[im 12/106]
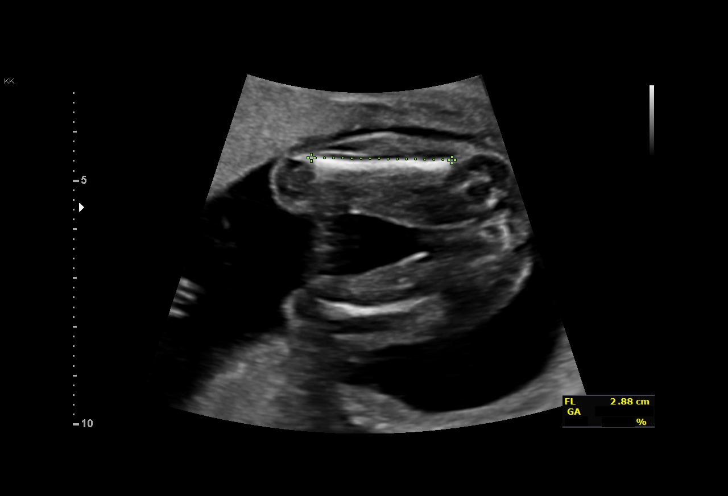
[im 20/106]
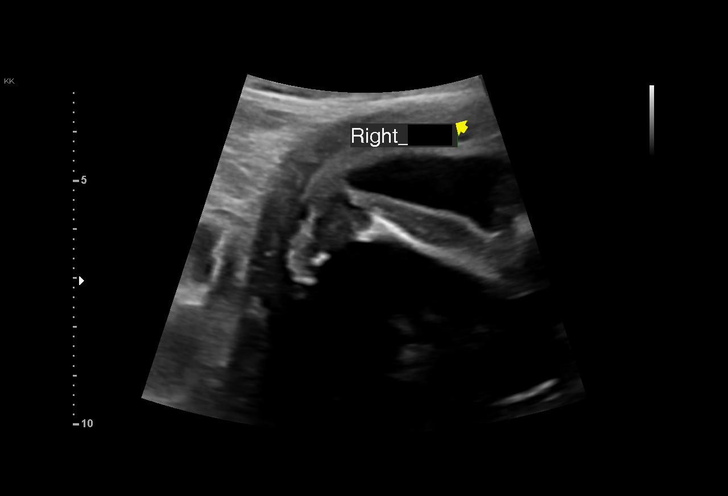
[im 28/106]
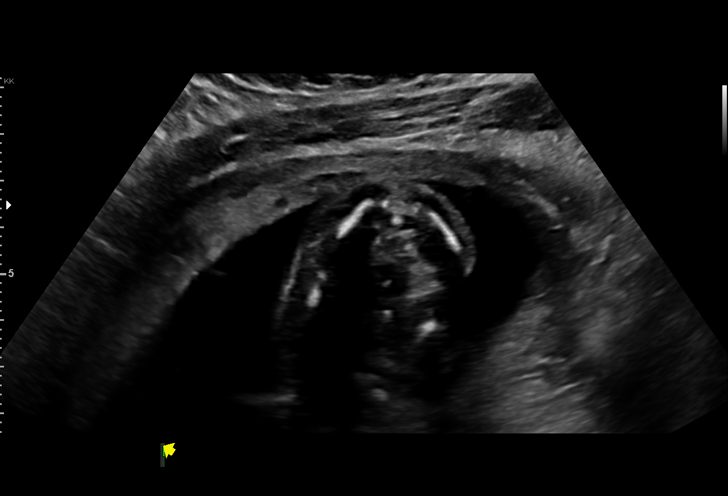
[im 36/106]
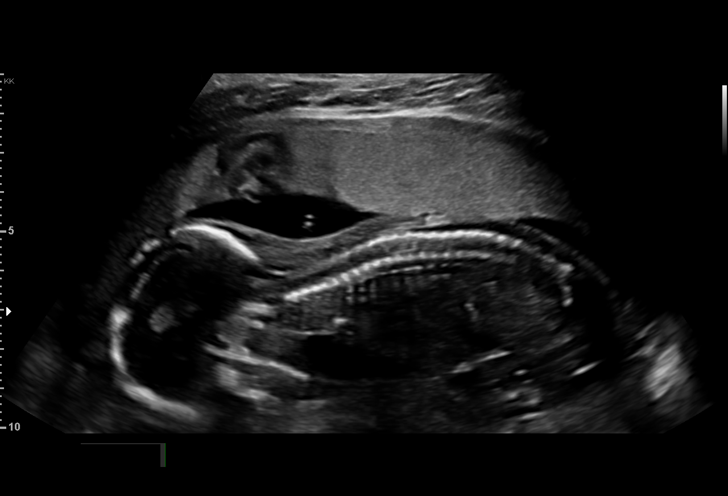
[im 43/106]
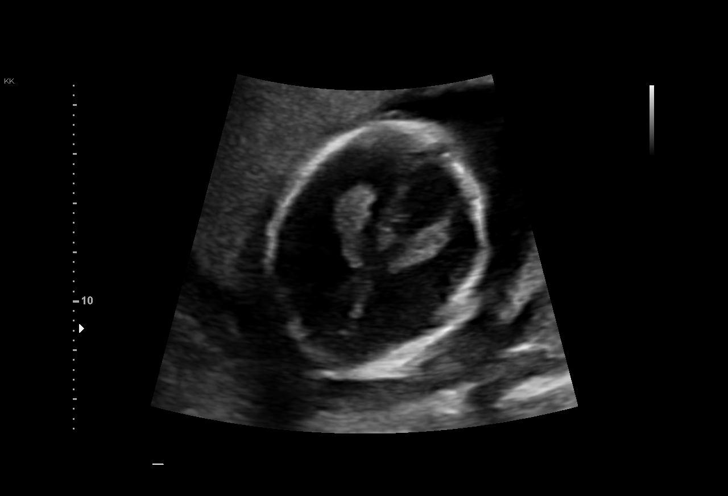
[im 51/106]
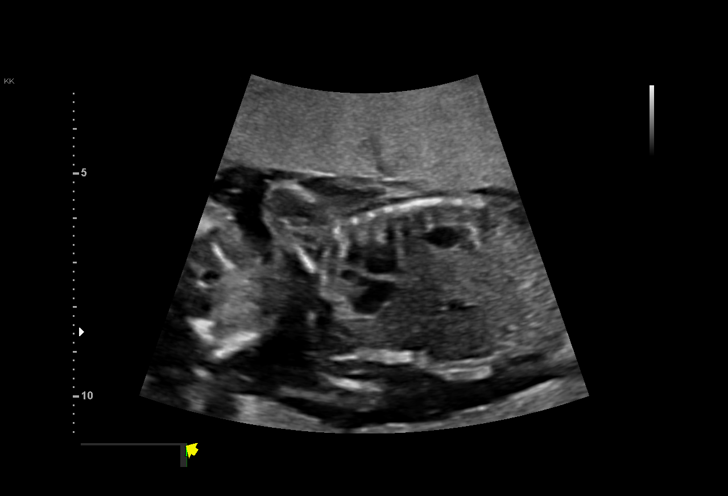
[im 59/106]
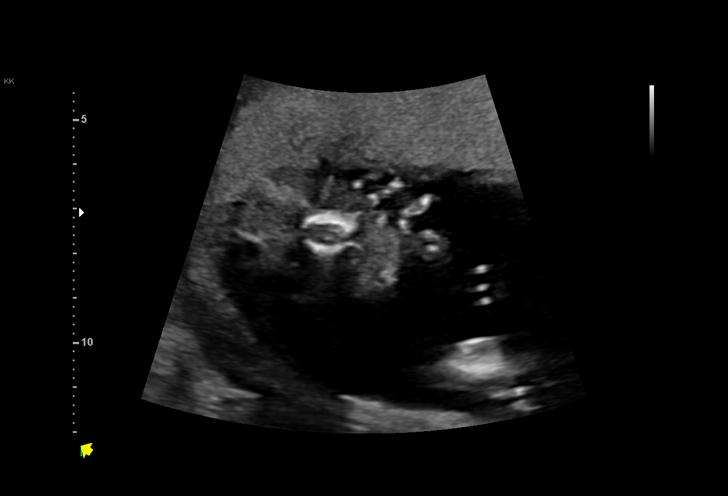
[im 67/106]
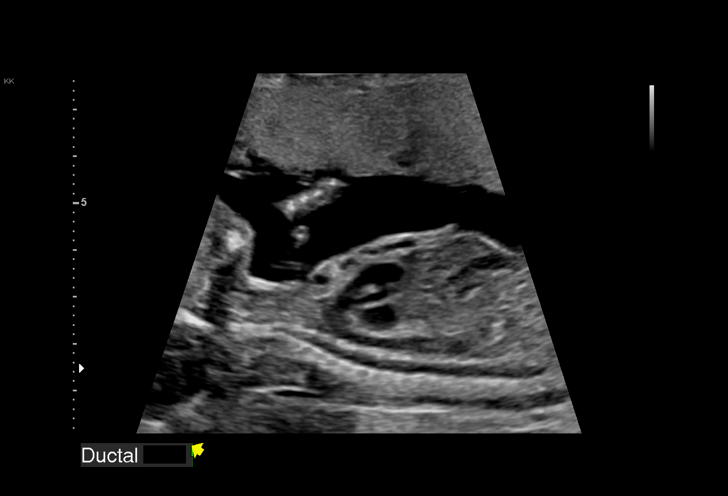
[im 74/106]
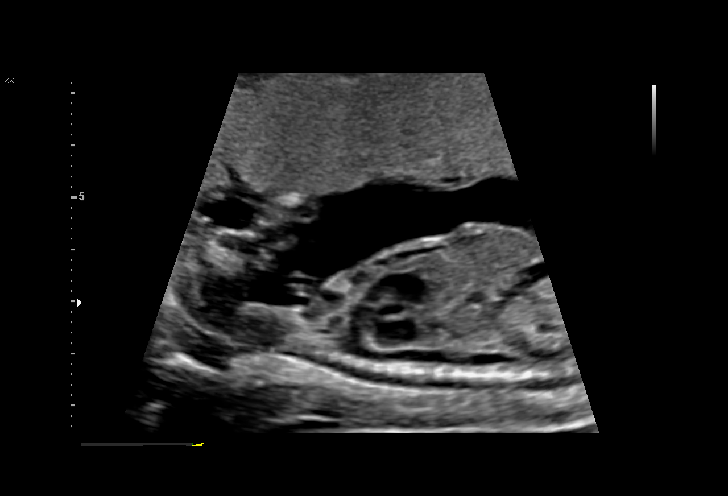
[im 82/106]
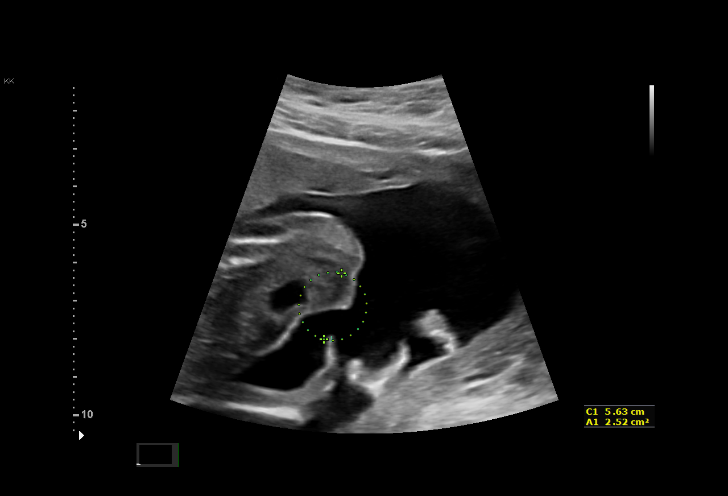
[im 90/106]
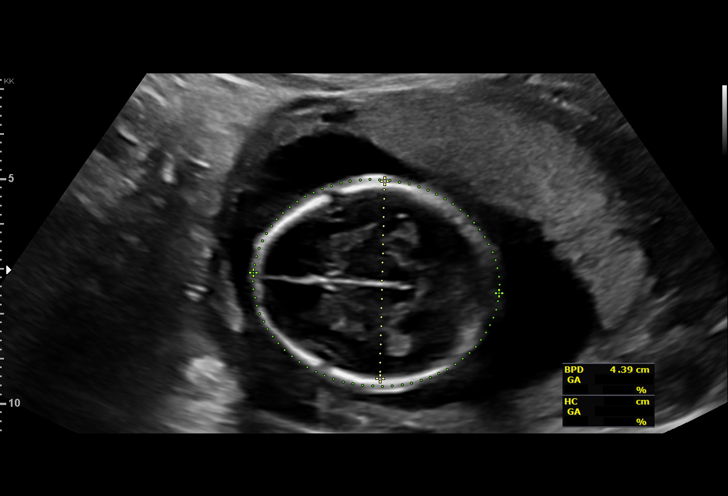
[im 98/106]
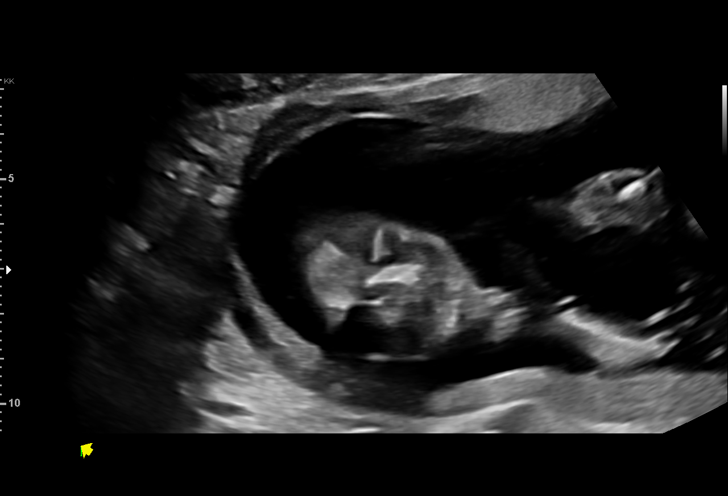
[im 106/106]
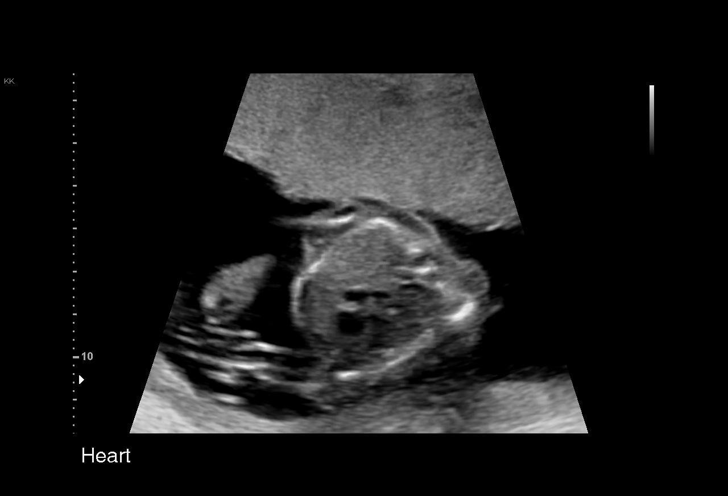

[14 of 28 positions shown; findings below may reference images not displayed]

1  US MFM OB COMP + 14 WK               76805.01     YUSSUF BLACKWELL

Indications

Encounter for antenatal screening for
malformations
19 weeks gestation of pregnancy
Vital Signs

BMI:
Fetal Evaluation

Num Of Fetuses:         1
Fetal Heart Rate(bpm):  154
Cardiac Activity:       Observed
Presentation:           Cephalic
Placenta:               Anterior
P. Cord Insertion:      Visualized

Amniotic Fluid
AFI FV:      Within normal limits

Largest Pocket(cm)
5.1
Biometry

BPD:      43.8  mm     G. Age:  19w 2d         49  %    CI:        75.37   %    70 - 86
FL/HC:      18.2   %    16.1 -
HC:       160   mm     G. Age:  18w 6d         22  %    HC/AC:      1.12        1.09 -
AC:      142.7  mm     G. Age:  19w 4d         56  %    FL/BPD:     66.4   %
FL:       29.1  mm     G. Age:  19w 0d         32  %    FL/AC:      20.4   %    20 - 24
NFT:       4.5  mm
Est. FW:     283  gm    0 lb 10 oz      46  %
OB History

Gravidity:    1
Gestational Age

LMP:           19w 2d        Date:  03/09/18                 EDD:   12/14/18
U/S Today:     19w 1d                                        EDD:   12/15/18
Best:          19w 2d     Det. By:  LMP  (03/09/18)          EDD:   12/14/18
Anatomy

Cranium:               Appears normal         Aortic Arch:            Appears normal
Cavum:                 Appears normal         Ductal Arch:            Appears normal
Ventricles:            Appears normal         Diaphragm:              Appears normal
Choroid Plexus:        Appears normal         Stomach:                Appears normal, left
sided
Cerebellum:            Appears normal         Abdomen:                Appears normal
Posterior Fossa:       Appears normal         Abdominal Wall:         Appears nml (cord
insert, abd wall)
Nuchal Fold:           Appears normal         Cord Vessels:           Appears normal (3
vessel cord)
Face:                  Appears normal         Kidneys:                Appear normal
(orbits and profile)
Lips:                  Appears normal         Bladder:                Appears normal
Thoracic:              Appears normal         Spine:                  Appears normal
Heart:                 Not well visualized    Upper Extremities:      Appears normal
RVOT:                  Not well visualized    Lower Extremities:      Appears normal
LVOT:                  Appears normal

Other:  Female gender Right Heel visualized. Technically difficult due to fetal
position.
Cervix Uterus Adnexa

Cervix
Length:            3.9  cm.
Normal appearance by transabdominal scan.
Comments

U/S images reviewed. Appropriate fetal growth is noted.  No
fetal abnormalities are seen.
Recommendations: 1) Completion of anatomy in 5 weeks
Recommendations

Completion of anatomy in 5 weeks

## 2020-02-11 ENCOUNTER — Ambulatory Visit: Payer: Medicaid Other | Admitting: Obstetrics and Gynecology

## 2020-02-11 ENCOUNTER — Other Ambulatory Visit (HOSPITAL_COMMUNITY)
Admission: RE | Admit: 2020-02-11 | Discharge: 2020-02-11 | Disposition: A | Discharge status: Home / Self Care | Payer: Medicaid Other | Source: Ambulatory Visit | Attending: Obstetrics and Gynecology | Admitting: Obstetrics and Gynecology

## 2020-02-11 ENCOUNTER — Encounter: Payer: Self-pay | Admitting: Obstetrics and Gynecology

## 2020-02-11 ENCOUNTER — Other Ambulatory Visit: Payer: Self-pay

## 2020-02-11 VITALS — BP 113/72 | HR 74 | Temp 98.3°F | Ht 64.0 in | Wt 145.0 lb

## 2020-02-11 DIAGNOSIS — N898 Other specified noninflammatory disorders of vagina: Secondary | ICD-10-CM

## 2020-02-11 DIAGNOSIS — Z01419 Encounter for gynecological examination (general) (routine) without abnormal findings: Secondary | ICD-10-CM

## 2020-02-11 DIAGNOSIS — Z0000 Encounter for general adult medical examination without abnormal findings: Secondary | ICD-10-CM

## 2020-02-11 DIAGNOSIS — Z0001 Encounter for general adult medical examination with abnormal findings: Secondary | ICD-10-CM

## 2020-02-11 DIAGNOSIS — B373 Candidiasis of vulva and vagina: Secondary | ICD-10-CM | POA: Diagnosis not present

## 2020-02-11 NOTE — Progress Notes (Signed)
GYNECOLOGY ANNUAL PREVENTATIVE CARE ENCOUNTER NOTE  History:     Janaisha Tolsma is a 34 y.o. G22P0101 female here for a routine annual gynecologic exam.  Current complaints: increase in discharge, no odor.   Denies abnormal vaginal bleeding, discharge, pelvic pain, problems with intercourse or other gynecologic concerns.   Gynecologic History No LMP recorded. (Menstrual status: IUD). Contraception: IUD- copper  Last Pap: 05/20/18. Results were: abnormal with positive HPV- ASC-US, did not have colpo  Last mammogram:NA  Obstetric History OB History  Gravida Para Term Preterm AB Living  1 1   1   1   SAB TAB Ectopic Multiple Live Births        0 1    # Outcome Date GA Lbr Len/2nd Weight Sex Delivery Anes PTL Lv  1 Preterm 11/06/18 [redacted]w[redacted]d 85:01 / 00:38 5 lb 5.7 oz (2.43 kg) F Vag-Spont EPI  LIV    Past Medical History:  Diagnosis Date  . Gestational diabetes     Past Surgical History:  Procedure Laterality Date  . NO PAST SURGERIES      Current Outpatient Medications on File Prior to Visit  Medication Sig Dispense Refill  . Levonorgestrel (LILETTA, 52 MG, IU) by Intrauterine route.    Marland Kitchen ibuprofen (ADVIL,MOTRIN) 600 MG tablet Take 1 tablet (600 mg total) by mouth every 6 (six) hours. (Patient not taking: Reported on 02/11/2020) 30 tablet 0   No current facility-administered medications on file prior to visit.    No Known Allergies  Social History:  reports that she has never smoked. She has never used smokeless tobacco. She reports that she does not drink alcohol or use drugs.  Family History  Problem Relation Age of Onset  . Hypertension Mother   . Hypothyroidism Mother   . Hypertension Father     The following portions of the patient's history were reviewed and updated as appropriate: allergies, current medications, past family history, past medical history, past social history, past surgical history and problem list.  Review of Systems Pertinent items noted in HPI  and remainder of comprehensive ROS otherwise negative.  Physical Exam:  BP 113/72   Pulse 74   Temp 98.3 F (36.8 C)   Ht 5\' 4"  (1.626 m)   Wt 145 lb (65.8 kg)   Breastfeeding No   BMI 24.89 kg/m  CONSTITUTIONAL: Well-developed, well-nourished female in no acute distress.  HENT:  Normocephalic, atraumatic, External right and left ear normal. Oropharynx is clear and moist EYES: Conjunctivae and EOM are normal. Pupils are equal, round, and reactive to light. No scleral icterus.  NECK: Normal range of motion, supple, no masses.  Normal thyroid.  SKIN: Skin is warm and dry. No rash noted. Not diaphoretic. No erythema. No pallor. MUSCULOSKELETAL: Normal range of motion. No tenderness.  No cyanosis, clubbing, or edema.  2+ distal pulses. NEUROLOGIC: Alert and oriented to person, place, and time. Normal reflexes, muscle tone coordination.  PSYCHIATRIC: Normal mood and affect. Normal behavior. Normal judgment and thought content. CARDIOVASCULAR: Normal heart rate noted, regular rhythm RESPIRATORY: Clear to auscultation bilaterally. Effort and breath sounds normal, no problems with respiration noted. BREASTS: Symmetric in size. No masses, tenderness, skin changes, nipple drainage, or lymphadenopathy bilaterally. Performed in the presence of a chaperone. ABDOMEN: Soft, no distention noted.  No tenderness, rebound or guarding.  PELVIC: Normal appearing external genitalia and urethral meatus; normal appearing vaginal mucosa and cervix.  No abnormal discharge noted.  Pap smear obtained. Small amount of bleeding following pap.  Normal uterine size, no other palpable masses, no uterine or adnexal tenderness.  Performed in the presence of a chaperone.   Assessment and Plan:   1. Well woman exam with routine gynecological exam  - Cytology - PAP( [place]) - If abnormal will need colpo, discussed this with patient in detail   2. Vaginal discharge  - Cervicovaginal ancillary only( [facility])   Will follow up results of pap smear and manage accordingly. Routine preventative health maintenance measures emphasized. Please refer to After Visit Summary for other counseling recommendations.     Kree Rafter, Harolyn Rutherford, NP [facility] [employer], [facility]

## 2020-02-12 ENCOUNTER — Other Ambulatory Visit: Payer: Self-pay | Admitting: Obstetrics and Gynecology

## 2020-02-12 LAB — CERVICOVAGINAL ANCILLARY ONLY
Bacterial Vaginitis (gardnerella): NEGATIVE
Candida Glabrata: NEGATIVE
Candida Vaginitis: POSITIVE — AB
Chlamydia: NEGATIVE
Comment: NEGATIVE
Comment: NEGATIVE
Comment: NEGATIVE
Comment: NEGATIVE
Comment: NEGATIVE
Comment: NORMAL
Neisseria Gonorrhea: NEGATIVE
Trichomonas: NEGATIVE

## 2020-02-12 MED ORDER — FLUCONAZOLE 150 MG PO TABS: 150.0000 mg | ORAL_TABLET | Freq: Every day | ORAL | 0 refills | Status: DC

## 2020-02-12 NOTE — Progress Notes (Signed)
Rx Diflucan

## 2020-02-13 LAB — CYTOLOGY - PAP
Comment: NEGATIVE
Diagnosis: UNDETERMINED — AB
High risk HPV: NEGATIVE

## 2020-02-17 ENCOUNTER — Other Ambulatory Visit: Payer: Self-pay | Admitting: Obstetrics and Gynecology

## 2020-02-17 MED ORDER — FLUCONAZOLE 150 MG PO TABS: 150.0000 mg | ORAL_TABLET | Freq: Every day | ORAL | 0 refills | Status: DC

## 2020-03-08 ENCOUNTER — Encounter: Payer: Medicaid Other | Admitting: Obstetrics & Gynecology

## 2020-05-09 IMAGING — US US MFM OB FOLLOW-UP
1 series · 14 of 27 positions shown · non-contrast
Comparison: none

[Series 1: us mfm ob follow-up · 27 acquisitions, 14 frames shown]
[im 1/27]
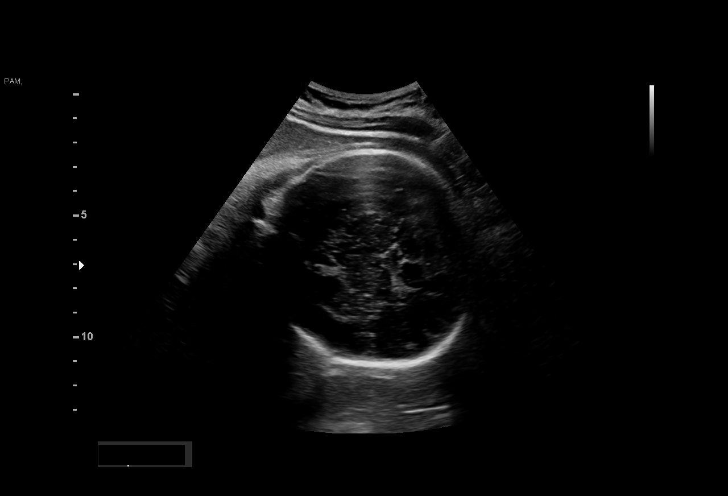
[im 3/27]
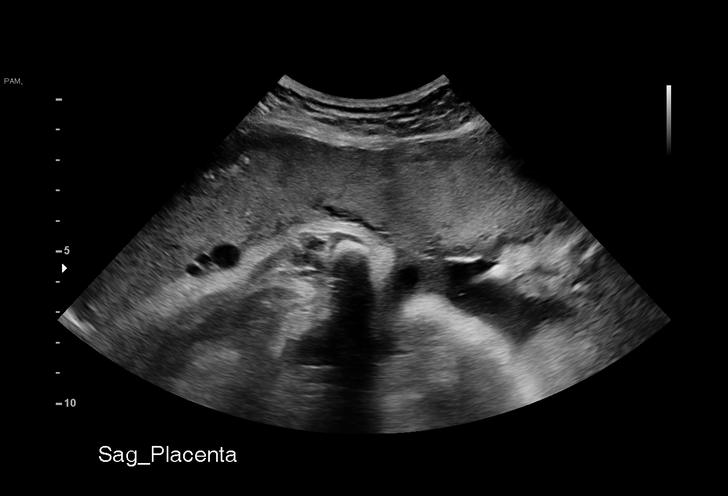
[im 5/27]
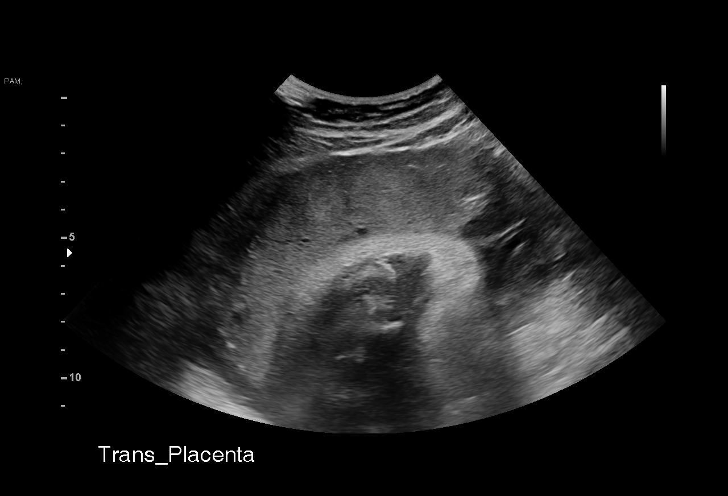
[im 7/27]
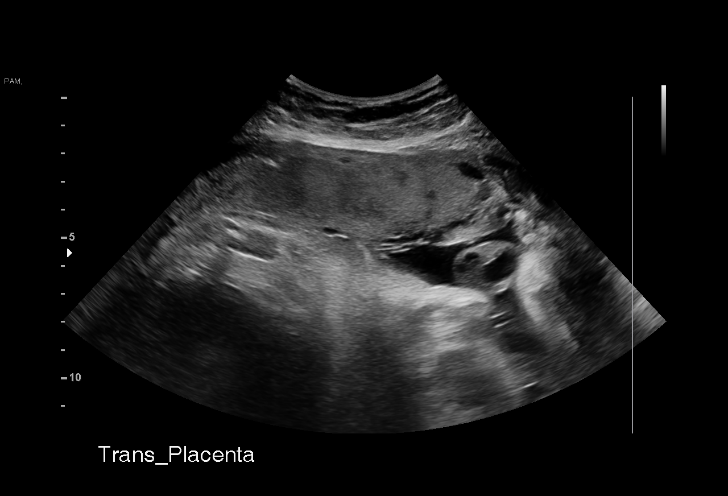
[im 9/27]
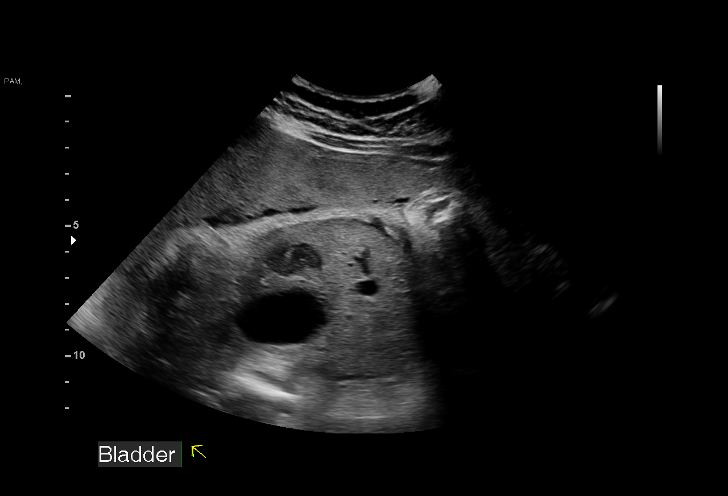
[im 11/27]
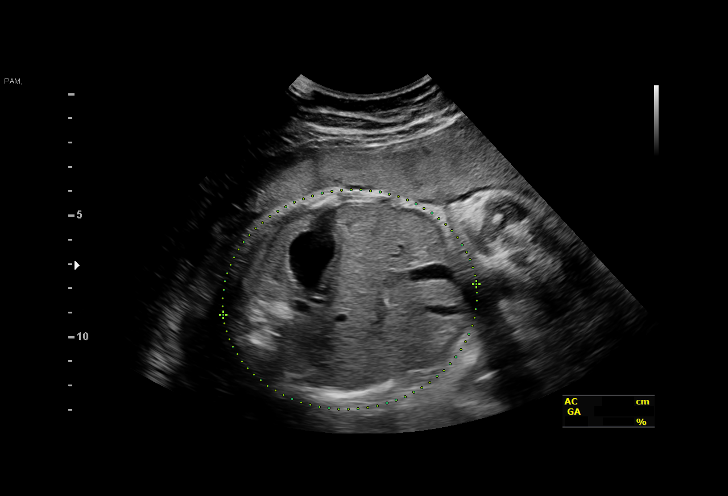
[im 13/27]
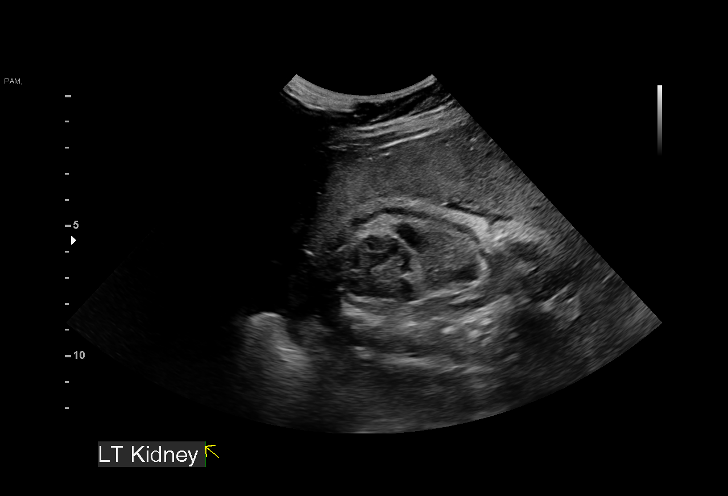
[im 15/27]
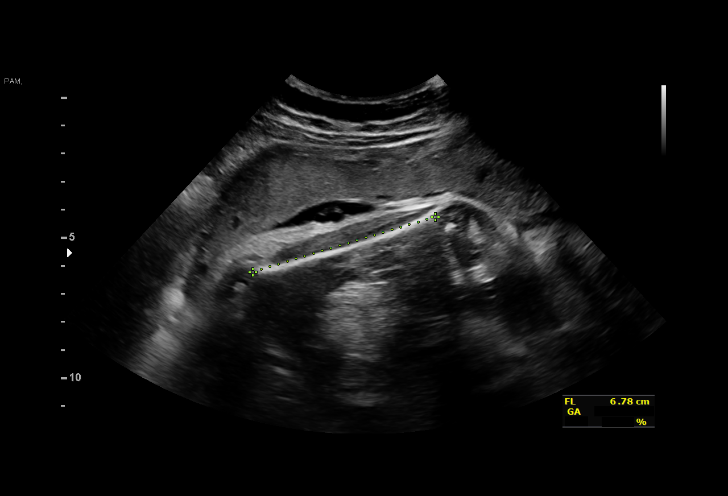
[im 17/27]
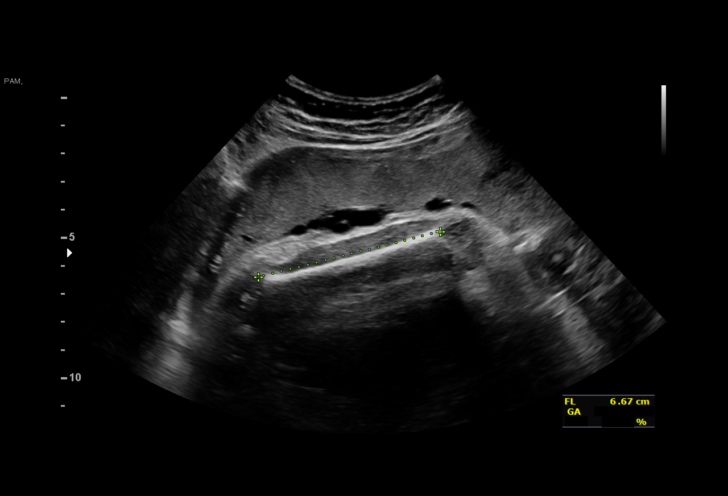
[im 19/27]
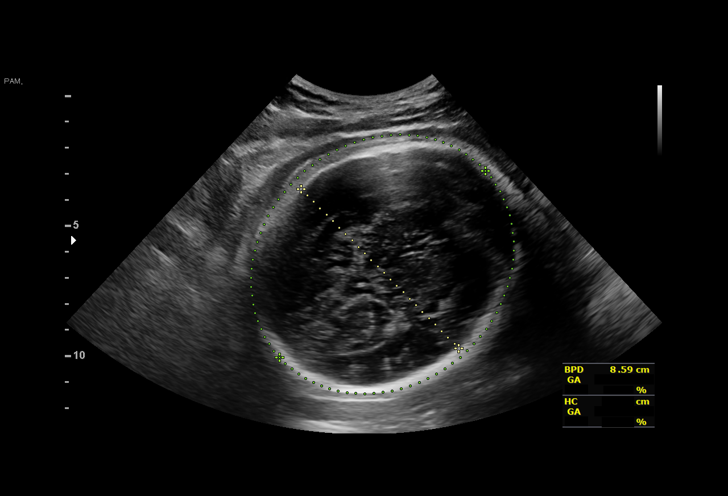
[im 21/27]
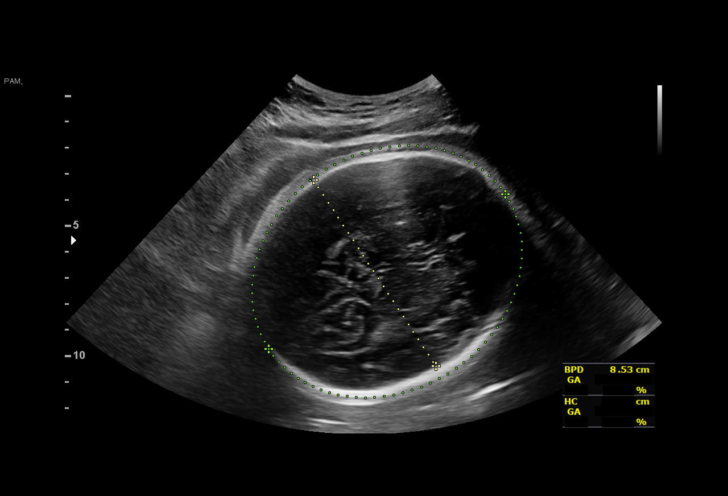
[im 23/27]
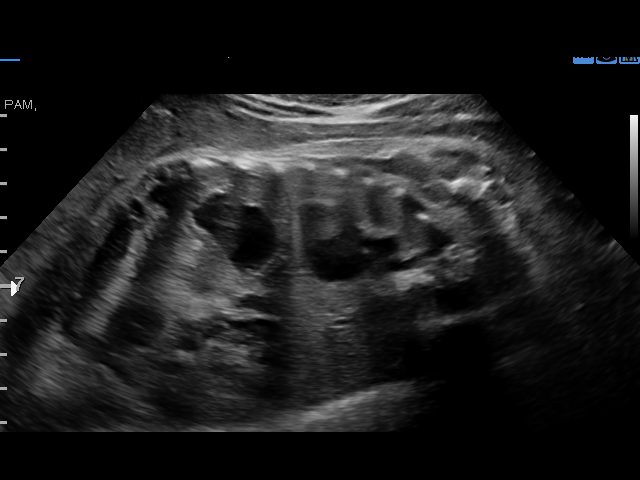
[im 25/27]
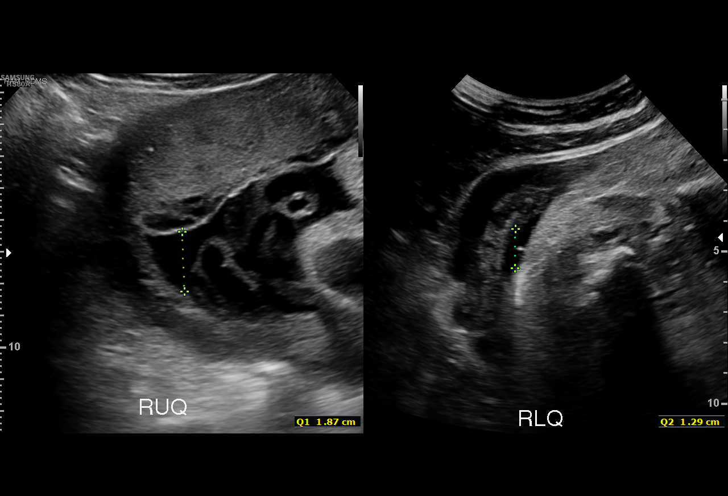
[im 27/27]
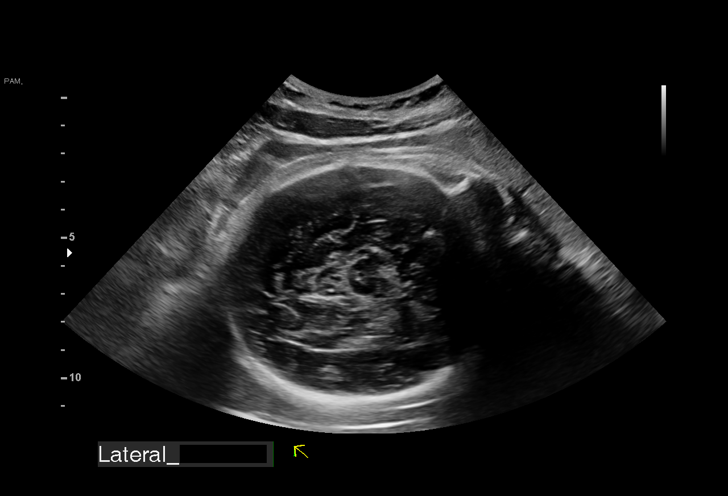

[14 of 27 positions shown; findings below may reference images not displayed]

for [REDACTED]care

 ----------------------------------------------------------------------

 ----------------------------------------------------------------------
Indications

  34 weeks gestation of pregnancy
  Premature rupture of membranes - leaking
  fluid
 ----------------------------------------------------------------------
Vital Signs

                                                Height:        5'4"
Fetal Evaluation

 Num Of Fetuses:          1
 Fetal Heart Rate(bpm):   159
 Cardiac Activity:        Observed
 Presentation:            Cephalic
 Placenta:                Anterior

 Amniotic Fluid
 AFI FV:      Within normal limits

 AFI Sum(cm)     %Tile       Largest Pocket(cm)
 12.67           39

 RUQ(cm)       RLQ(cm)       LUQ(cm)        LLQ(cm)

Biometry

 BPD:      85.8  mm     G. Age:  34w 4d         62  %    CI:        75.51   %    70 - 86
                                                         FL/HC:       21.4  %    19.4 -
 HC:      313.1  mm     G. Age:  35w 1d         38  %    HC/AC:       1.04       0.96 -
 AC:      302.2  mm     G. Age:  34w 2d         55  %    FL/BPD:      78.2  %    71 - 87
 FL:       67.1  mm     G. Age:  34w 4d         50  %    FL/AC:       22.2  %    20 - 24

 Est. FW:    7871   gm     5 lb 5 oz     65  %
OB History

 Gravidity:    1
Gestational Age

 LMP:           34w 1d        Date:  03/09/18                 EDD:   12/14/18
 U/S Today:     34w 5d                                        EDD:   12/10/18
 Best:          34w 1d     Det. By:  LMP  (03/09/18)          EDD:   12/14/18
Anatomy

 Cranium:               Appears normal         Aortic Arch:            Previously seen
 Cavum:                 Appears normal         Ductal Arch:            Previously seen
 Ventricles:            Appears normal         Diaphragm:              Appears normal
 Choroid Plexus:        Appears normal         Stomach:                Appears normal, left
                                                                       sided
 Cerebellum:            Appears normal         Abdomen:                Appears normal
 Posterior Fossa:       Appears normal         Abdominal Wall:         Previously seen
 Nuchal Fold:           Previously seen        Cord Vessels:           Previously seen
 Face:                  Orbits and profile     Kidneys:                Appear normal
                        previously seen
 Lips:                  Previously seen        Bladder:                Appears normal
 Thoracic:              Appears normal         Spine:                  Previously seen
 Heart:                 Appears normal         Upper Extremities:      Previously seen
                        (4CH, axis, and situs
 RVOT:                  Previously seen        Lower Extremities:      Previously seen
 LVOT:                  Previously seen
Impression

 Patient is admitted with suspected PPROM.
 Amniotic fluid is normal and good fetal activity is seen. Fetal
 growth is appropriate for gestational age.
                 Schlueter, [place]

## 2020-10-18 ENCOUNTER — Telehealth: Payer: Self-pay | Admitting: *Deleted

## 2020-10-18 NOTE — Telephone Encounter (Signed)
Left patient a message with scheduled appointment information. Scheduled from MyChart message.

## 2020-10-26 ENCOUNTER — Telehealth: Payer: Self-pay | Admitting: *Deleted

## 2020-10-26 NOTE — Telephone Encounter (Signed)
Left patient a message to call and clarify appointment scheduled for 10/29/2020. Office needs to know if appointment needs to be rescheduled to 11/09/2020 with Misty Stanley and what time.

## 2020-10-29 ENCOUNTER — Ambulatory Visit: Payer: Medicaid Other | Admitting: Women's Health

## 2020-11-09 ENCOUNTER — Ambulatory Visit: Payer: Medicaid Other | Admitting: Advanced Practice Midwife

## 2020-11-09 ENCOUNTER — Encounter: Payer: Self-pay | Admitting: Advanced Practice Midwife

## 2020-11-09 ENCOUNTER — Other Ambulatory Visit: Payer: Self-pay

## 2020-11-09 VITALS — BP 112/72 | HR 76 | Resp 16 | Ht 64.0 in | Wt 154.0 lb

## 2020-11-09 DIAGNOSIS — Z30432 Encounter for removal of intrauterine contraceptive device: Secondary | ICD-10-CM | POA: Diagnosis not present

## 2020-11-09 NOTE — Patient Instructions (Signed)
Preparing for Pregnancy If you are planning to become pregnant, talk to your health care provider about preconception care. This type of care helps you prepare for a safe and healthy pregnancy. During this visit, your health care provider will:  Do a complete physical exam, including a Pap test.  Take your complete medical history.  Give you information, answer your questions, and help you resolve problems. Preconception checklist Medical history  Tell your health care provider about any medical conditions you have or have had. Your pregnancy or your ability to become pregnant may be affected by long-term (chronic) conditions, such as: ? Diabetes. ? High blood pressure (hypertension). ? Thyroid problems.  Tell your health care provider about your family's medical history and your partner's medical history.   Tell your health care provider if you have or have had any sexually transmitted infections, or STIs. These can affect your pregnancy. In some cases, they can be passed to your baby.  If needed, discuss the benefits of genetic testing. This test checks for conditions that may be passed from parent to child.  Tell your health care provider about: ? Any problems you had getting pregnant or while pregnant. ? Any medicines you take. These include vitamins, herbal supplements, and over-the-counter medicines. ? Your history of getting vaccines. Discuss any vaccines that you may need. Diet  Ask your health care provider about what foods to eat in order to get a balance of nutrients. This is especially important when you are pregnant or preparing to become pregnant. It is recommended that women of childbearing age take a folic acid supplement of 400 mcg daily and eat foods rich in folic acid to prevent certain birth defects.  Ask your health care provider to help you reach a healthy weight before pregnancy. ? If you are overweight, you may have a higher risk for certain problems. These  include hypertension, diabetes, and early (preterm) birth. ? If you are underweight, you are more likely to have a baby who has a low birth weight. Lifestyle, work, and home Let your health care provider know about:  Any lifestyle habits that you have, such as use of alcohol, drugs, or tobacco products.  Fun and leisure activities that may put you at risk during pregnancy, such as downhill skiing and certain exercise programs.  Any plans to travel out of the country, especially to places with an active Zika virus outbreak.  Harmful substances that you may be exposed to at work or at home. These include chemicals, pesticides, radiation, and substances from cat litter boxes.  Any concerns you have for your safety at home. Mental health Tell your health care provider about:  Any history of mental health conditions, including feelings of depression, sadness, or anxiety.  Any medicines that you take for a mental health condition. These include herbs and supplements. How do I know that I am pregnant? You may be pregnant if you have been sexually active and you miss your period. Other symptoms of early pregnancy include:  Mild cramping.  Very light vaginal bleeding (spotting).  Feeling more tired than usual.  Nausea and vomiting. These may be signs of morning sickness. Take a home pregnancy test if you have any of these symptoms. This test checks for a hormone in your urine called human chorionic gonadotropin, or hCG. A woman's body begins to make this hormone during early pregnancy. These tests are very accurate. Wait until at least the first day after you miss your period to take a   home pregnancy test. If the test shows that you are pregnant, call your health care provider for a prenatal care visit. What should I do if I become pregnant?  Schedule a visit with your health care provider as soon as you suspect you are pregnant.  Talk to your health care provider if you are taking  prescription medicines to determine if they are safe to take during pregnancy.  You may continue to have sex if it does not cause pain or other problems, such as vaginal bleeding. Follow these instructions at home: Eating and drinking  Follow instructions from your health care provider about eating or drinking restrictions.  Drink enough fluid to keep your urine pale yellow.  Eat a balanced diet. This includes fresh fruits and vegetables, whole grains, lean meats, low-fat dairy products, healthy fats, and foods that are high in fiber. Ask to meet with a nutritionist or registered dietitian for help with meal planning and goals.  Avoid eating raw or undercooked meat and seafood.  Avoid eating or drinking unpasteurized dairy products.   Lifestyle  Get regular exercise. Try to be active for at least 30 minutes a day on most days of the week. Ask your health care provider which activities are safe during pregnancy.  Maintain a healthy weight.  Avoid toxic fumes and chemicals.  Avoid cleaning cat litter boxes. Cat feces may contain a harmful parasite called toxoplasma.  Avoid travel to countries where Zika virus is common.  Do not use any products that contain nicotine or tobacco, such as cigarettes, e-cigarettes, and chewing tobacco. If you need help quitting, ask your health care provider.  Do not drink alcohol or use drugs.      General instructions  Keep an accurate record of your menstrual periods. This makes it easier for your health care provider to determine your baby's due date.  Take over-the-counter and prescription medicines only as told by your health care provider.  Begin taking prenatal vitamins and folic acid supplements daily as directed.  Manage any chronic conditions, such as hypertension and diabetes, as told by your health care provider. This is important. Summary  If you are planning to become pregnant, talk to your health care provider about preconception  care. This is an important part of planning for a healthy pregnancy.  Women of childbearing age should take 400 mcg of folic acid daily in addition to eating a diet rich in folic acid. This will prevent certain birth defects.  Schedule a visit with your health care provider as soon as you suspect you are pregnant. Tell your health care provider about your medical history, lifestyle activities, home safety, and other things that may concern you. This information is not intended to replace advice given to you by your health care provider. Make sure you discuss any questions you have with your health care provider. Document Revised: 07/09/2019 Document Reviewed: 07/09/2019 Elsevier Patient Education  2021 Elsevier Inc.  

## 2020-11-09 NOTE — Progress Notes (Signed)
    GYNECOLOGY CLINIC PROCEDURE NOTE  [name] is a 35 y.o. G1P0101 here for Mirena IUD removal. No GYN concerns.  Last pap smear was on 02/11/20 and was abnormal with ASCUS, needs repeat on 02/10/21.  IUD Removal  Patient was in the dorsal lithotomy position, normal external genitalia was noted.  A speculum was placed in the patient's vagina, normal discharge was noted, no lesions. The multiparous cervix was visualized, no lesions, no abnormal discharge.  The strings of the IUD were grasped and pulled using ring forceps. The IUD was removed in its entirety.  Patient tolerated the procedure well.    Patient plans for pregnancy soon and she was told to avoid teratogens, take PNV and folic acid.  Routine preventative health maintenance measures emphasized.  Hurshel Party, CNM 11/09/2020 11:06 AM

## 2020-12-14 ENCOUNTER — Encounter: Payer: Self-pay | Admitting: Certified Nurse Midwife

## 2020-12-14 ENCOUNTER — Ambulatory Visit: Payer: Medicaid Other | Admitting: Certified Nurse Midwife

## 2020-12-14 ENCOUNTER — Other Ambulatory Visit: Payer: Self-pay

## 2020-12-14 VITALS — Resp 16 | Ht 64.0 in | Wt 154.0 lb

## 2020-12-14 DIAGNOSIS — N912 Amenorrhea, unspecified: Secondary | ICD-10-CM

## 2020-12-14 LAB — POCT URINE PREGNANCY: Preg Test, Ur: NEGATIVE

## 2020-12-14 LAB — HCG, QUANTITATIVE, PREGNANCY: HCG, Total, QN: <3 m[IU]/mL

## 2020-12-14 NOTE — Progress Notes (Signed)
History:  [name] is a 35 y.o. G1P0101 who presents to clinic today for amenorrhea. Patient reports that she is 3 days late for her cycle. Patient reports taking out her IUD last month and has been actively trying to conceive. Patient has IC on 2/5 when she was ovulating and was supposed to start her cycle on 2/19 but didn't.   Patient reports small amount of light pink spotting yesterday and today while getting a urine sample dark red bleeding started. She is unsure of whether this is her cycle or implantation bleeding. She request HCG lab today.   The following portions of the patient's history were reviewed and updated as appropriate: allergies, current medications, family history, past medical history, social history, past surgical history and problem list.  Review of Systems:  Review of Systems  Constitutional: Negative.   Respiratory: Negative.   Cardiovascular: Negative.   Gastrointestinal: Negative.   Genitourinary:       Vaginal spotting  Musculoskeletal: Negative.   Neurological: Negative.      Objective:  Physical Exam Resp 16   Ht 5\' 4"  (1.626 m)   Wt 154 lb (69.9 kg)   LMP 11/11/2020   BMI 26.43 kg/m  Physical Exam Constitutional:      Appearance: Normal appearance.  HENT:     Head: Normocephalic.  Cardiovascular:     Rate and Rhythm: Normal rate and regular rhythm.  Pulmonary:     Effort: Pulmonary effort is normal. No respiratory distress.     Breath sounds: Normal breath sounds. No wheezing.  Skin:    General: Skin is warm and dry.  Neurological:     Mental Status: She is alert and oriented to person, place, and time.  Psychiatric:        Mood and Affect: Mood normal.        Behavior: Behavior normal.        Thought Content: Thought content normal.     Labs and Imaging Results for orders placed or performed in visit on 12/14/20 (from the past 24 hour(s))  POCT urine pregnancy     Status: Normal   Collection Time: 12/14/20 10:57 AM  Result  Value Ref Range   Preg Test, Ur Negative Negative    Assessment & Plan:  1. Amenorrhea - UPT negative in office today will obtain HCG level  - Educated and discussed with patient that after removal of IUD cycles can take 1-2 months to go back to normal prior to IUD  - Will manage according based on results of HCG  - B-HCG Quant - POCT urine pregnancy   12/16/20, CNM 12/14/2020 11:08 AM

## 2021-07-30 NOTE — Progress Notes (Signed)
GYNECOLOGY OFFICE VISIT NOTE  History:   Sabrina Rogers is a 35 y.o. G1P0101 here today for discussion of preterm birth in s/o secondary amenorrhea with home UPT being positive. Her prior delivery was due to PPROM at [redacted]w[redacted]d with delivery by SVD at [redacted]w[redacted]d with pregnancy c/b GDMA1. She denies any abnormal vaginal discharge, bleeding, pelvic pain or other concerns.   LMP 9/3.     Past Medical History:  Diagnosis Date   Gestational diabetes     Past Surgical History:  Procedure Laterality Date   NO PAST SURGERIES      The following portions of the patient's history were reviewed and updated as appropriate: allergies, current medications, past family history, past medical history, past social history, past surgical history and problem list.   Health Maintenance:   2019: ASCUS/HPV pos 01/2020: ASCUS/HPV negative   Review of Systems:  Pertinent items noted in HPI and remainder of comprehensive ROS otherwise negative.  Physical Exam:  BP 131/87   Pulse 87   Resp 16   Ht 5\' 4"  (1.626 m)   Wt 163 lb (73.9 kg)   LMP 11/11/2020   BMI 27.98 kg/m  CONSTITUTIONAL: Well-developed, well-nourished female in no acute distress.  HEENT:  Normocephalic, atraumatic. External right and left ear normal. No scleral icterus.  NECK: Normal range of motion, supple, no masses noted on observation SKIN: No rash noted. Not diaphoretic. No erythema. No pallor. MUSCULOSKELETAL: Normal range of motion. No edema noted. NEUROLOGIC: Alert and oriented to person, place, and time. Normal muscle tone coordination. No cranial nerve deficit noted. PSYCHIATRIC: Normal mood and affect. Normal behavior. Normal judgment and thought content.  CARDIOVASCULAR: Normal heart rate noted RESPIRATORY: Effort and breath sounds normal, no problems with respiration noted ABDOMEN: No masses noted. No other overt distention noted.    PELVIC: Deferred  Labs and Imaging Results for orders placed or performed in visit on  08/01/21 (from the past 168 hour(s))  POCT urine pregnancy   Collection Time: 08/01/21  9:53 AM  Result Value Ref Range   Preg Test, Ur Positive (A) Negative   No results found.    Assessment and Plan:    1. Secondary amenorrhea - Positive UPT at home - Confirmation done here -- positive - Schedule for new OB intake at about 8 weeks so we can do early 10/01/21 to confirm EDC due to history of PTB  2. History of preterm delivery, currently pregnant in first trimester - Discussed h/o of PTB at [redacted]w[redacted]d in s/o PPROM at [redacted]w[redacted]d.  - Discussed risk of recurrence of about 20% - Discussed questionable benefit of 17OHP given conflicting research studies. Discussed generally considered to be of limited benefit and that most women even without using 17OHP will go on to deliver FT in subsequent pregnancy. She will consider this option.  - Discussed measures to monitor the pregnancy I.e. cervical length but these are performed only until 24. However if decreased CL she would be a candidate for vaginal progesterone - She does not meet criteria for history indicated cerclage given PPROM at 34w and cervix at the time of PPROM was closed and history does not suggest incompetence, however if decreased CL during the pregnancy then we could consider a rescue cerclage.    3. GDMA1 - Will need early 1 hr with NOB labs.    Routine preventative health maintenance measures emphasized. Please refer to After Visit Summary for other counseling recommendations.   Return in about 1 month (around 09/01/2021) for OB  VISIT, MD or APP.    I spent 25 minutes dedicated to the care of this patient including pre-visit review of records, face to face time with the patient discussing her conditions and treatments and post visit orders.    Milas Hock, MD, FACOG Obstetrician & Gynecologist, [facility] for [facility], [facility]

## 2021-08-01 ENCOUNTER — Other Ambulatory Visit: Payer: Self-pay

## 2021-08-01 ENCOUNTER — Ambulatory Visit: Payer: Medicaid Other | Admitting: Obstetrics and Gynecology

## 2021-08-01 ENCOUNTER — Encounter: Payer: Self-pay | Admitting: Obstetrics and Gynecology

## 2021-08-01 VITALS — BP 131/87 | HR 87 | Resp 16 | Ht 64.0 in | Wt 163.0 lb

## 2021-08-01 DIAGNOSIS — N911 Secondary amenorrhea: Secondary | ICD-10-CM

## 2021-08-01 DIAGNOSIS — O09891 Supervision of other high risk pregnancies, first trimester: Secondary | ICD-10-CM

## 2021-08-01 DIAGNOSIS — Z3201 Encounter for pregnancy test, result positive: Secondary | ICD-10-CM

## 2021-08-01 LAB — POCT URINE PREGNANCY: Preg Test, Ur: POSITIVE — AB

## 2021-08-03 ENCOUNTER — Encounter: Payer: Self-pay | Admitting: *Deleted

## 2021-08-17 ENCOUNTER — Ambulatory Visit: Payer: Medicaid Other

## 2021-08-17 ENCOUNTER — Other Ambulatory Visit (HOSPITAL_COMMUNITY)
Admission: RE | Admit: 2021-08-17 | Discharge: 2021-08-17 | Disposition: A | Discharge status: Home / Self Care | Payer: Medicaid Other | Source: Ambulatory Visit

## 2021-08-17 ENCOUNTER — Other Ambulatory Visit: Payer: Self-pay

## 2021-08-17 VITALS — BP 128/81 | HR 71 | Resp 16 | Ht 64.0 in | Wt 163.0 lb

## 2021-08-17 DIAGNOSIS — Z3A01 Less than 8 weeks gestation of pregnancy: Secondary | ICD-10-CM | POA: Diagnosis not present

## 2021-08-17 DIAGNOSIS — O26859 Spotting complicating pregnancy, unspecified trimester: Secondary | ICD-10-CM | POA: Insufficient documentation

## 2021-08-17 NOTE — Progress Notes (Signed)
Bedside U/S today shows single IUP with FHT of 167 BPM and CRL measures 7.96mm  GA [redacted]w[redacted]d.  Pt does have a strong vaginal odor and has noticed some light spotting this AM.  She denies having recent intercourse.  Prominent LT CL noted.  No free fluid noted in the adnexa.

## 2021-08-17 NOTE — Progress Notes (Signed)
History:  [name] is a 35 y.o. G2P0101 who presents to clinic today for vaginal bleeding. She is early pregnant and saw bright red bleeding when she wiped this am. She denies any pain or recent intercourse   The following portions of the patient's history were reviewed and updated as appropriate: allergies, current medications, family history, past medical history, social history, past surgical history and problem list.  Review of Systems:  Review of Systems  Constitutional: Negative.  Negative for chills and fever.  Respiratory: Negative.    Cardiovascular: Negative.  Negative for chest pain.  Genitourinary: Negative.        Vaginal bleeding  Neurological: Negative.  Negative for dizziness and headaches.     Objective:  Physical Exam BP 128/81   Pulse 71   Resp 16   Ht 5\' 4"  (1.626 m)   Wt 163 lb (73.9 kg)   LMP 11/11/2020   BMI 27.98 kg/m  Physical Exam Vitals and nursing note reviewed.  Constitutional:      General: She is not in acute distress.    Appearance: She is well-developed.  HENT:     Head: Normocephalic.  Eyes:     Pupils: Pupils are equal, round, and reactive to light.  Cardiovascular:     Rate and Rhythm: Normal rate and regular rhythm.  Pulmonary:     Effort: Pulmonary effort is normal. No respiratory distress.     Breath sounds: Normal breath sounds.  Abdominal:     Palpations: Abdomen is soft.     Tenderness: There is no abdominal tenderness.  Musculoskeletal:        General: Normal range of motion.     Cervical back: Normal range of motion.  Skin:    General: Skin is warm and dry.  Neurological:     Mental Status: She is alert and oriented to person, place, and time.  Psychiatric:        Behavior: Behavior normal.        Thought Content: Thought content normal.        Judgment: Judgment normal.   Assessment & Plan:  1. Spotting in early pregnancy - Ultrasound confirms early IUP with FHR. Discussed possible reasons for bleeding  including subchorionic hemorrhage resolving, vaginal infections, cervical friability. Will test for vaginal infections today.   - Cervicovaginal ancillary only( [facility])  2. [redacted] weeks gestation of pregnancy -New OB appointment scheduled for next week.    11/13/2020, Rolm Bookbinder 08/17/2021 11:18 AM

## 2021-08-18 LAB — CERVICOVAGINAL ANCILLARY ONLY
Bacterial Vaginitis (gardnerella): NEGATIVE
Candida Glabrata: NEGATIVE
Candida Vaginitis: NEGATIVE
Chlamydia: NEGATIVE
Comment: NEGATIVE
Comment: NEGATIVE
Comment: NEGATIVE
Comment: NEGATIVE
Comment: NEGATIVE
Comment: NORMAL
Neisseria Gonorrhea: NEGATIVE
Trichomonas: NEGATIVE

## 2021-08-22 ENCOUNTER — Encounter: Payer: Self-pay | Admitting: *Deleted

## 2021-08-22 DIAGNOSIS — Z3480 Encounter for supervision of other normal pregnancy, unspecified trimester: Secondary | ICD-10-CM | POA: Insufficient documentation

## 2021-08-25 ENCOUNTER — Encounter: Payer: Self-pay | Admitting: Obstetrics and Gynecology

## 2021-08-25 ENCOUNTER — Other Ambulatory Visit: Payer: Self-pay

## 2021-08-25 ENCOUNTER — Ambulatory Visit (INDEPENDENT_AMBULATORY_CARE_PROVIDER_SITE_OTHER): Payer: Medicaid Other | Admitting: Obstetrics and Gynecology

## 2021-08-25 VITALS — BP 153/92 | HR 78 | Wt 163.0 lb

## 2021-08-25 DIAGNOSIS — Z3480 Encounter for supervision of other normal pregnancy, unspecified trimester: Secondary | ICD-10-CM

## 2021-08-25 DIAGNOSIS — O039 Complete or unspecified spontaneous abortion without complication: Secondary | ICD-10-CM | POA: Diagnosis not present

## 2021-08-25 NOTE — Progress Notes (Signed)
Bedside U/S today shows empty uterus with a .67cm endometrial lining.  Pt did have pictures of where she had passed clots, mostly small but one significant clot noted.

## 2021-08-25 NOTE — Progress Notes (Signed)
   GYNECOLOGY OFFICE VISIT NOTE  History:   Sabrina Rogers is a 35 y.o. G2P0101 here today for new OB but bedside US showed US findings of EL 6.21mm and no yolk sac/fetal pole whereas last week she had cardiac activity with FHT 160s. She had some heavy bleeding since that appt with passage of clots. She showed me pictures from her phone. She otherwise feels well. She is here with her husband. She is tearful.    She denies any abnormal vaginal discharge, bleeding, pelvic pain or other concerns.     Past Medical History:  Diagnosis Date   Gestational diabetes    Vaginal Pap smear, abnormal     Past Surgical History:  Procedure Laterality Date   NO PAST SURGERIES      The following portions of the patient's history were reviewed and updated as appropriate: allergies, current medications, past family history, past medical history, past social history, past surgical history and problem list.   Health Maintenance:   HPV negative 01/2020.  Diagnosis  Date Value Ref Range Status  02/11/2020 (A)  Final   - Atypical squamous cells of undetermined significance (ASC-US)    Review of Systems:  Pertinent items noted in HPI and remainder of comprehensive ROS otherwise negative.  Physical Exam:  BP (!) 153/92   Pulse 78   Wt 163 lb (73.9 kg)   BMI 27.98 kg/m  CONSTITUTIONAL: Well-developed, well-nourished female in no acute distress.  HEENT:  Normocephalic, atraumatic. External right and left ear normal. No scleral icterus.  NECK: Normal range of motion, supple, no masses noted on observation SKIN: No rash noted. Not diaphoretic. No erythema. No pallor. MUSCULOSKELETAL: Normal range of motion. No edema noted. NEUROLOGIC: Alert and oriented to person, place, and time. Normal muscle tone coordination. No cranial nerve deficit noted. PSYCHIATRIC: Normal mood and affect. Normal behavior. Normal judgment and thought content.  CARDIOVASCULAR: Normal heart rate noted RESPIRATORY: Effort and  breath sounds normal, no problems with respiration noted ABDOMEN: No masses noted. No other overt distention noted.    PELVIC: Deferred  Labs and Imaging No results found for this or any previous visit (from the past 168 hour(s)). No results found.  Assessment and Plan:    1. Complete miscarriage - Discussed common causes of SAB I.e. genetics and anatomic defects. Reassured her this was nothing in her control and no way to prevent these from occurring. Discussed she is not at an increased risk of miscarriage due to this occurrence.  - Discussed plan for subsequent pregnancy i.e. serials betas and/or early [place] (typically around 7-8wks). In her case, early [place] would be more beneficial.  - Discussed that she should wait for one normal cycle and then they may begin trying again if she feels emotionally ready as well - Discussed timing and extent of evaluation in the s/o RPL - She will continue her PNV - Answered all questions  Routine preventative health maintenance measures emphasized. Please refer to After Visit Summary for other counseling recommendations.   No follow-ups on file.  I spent 30 minutes dedicated to the care of this patient including pre-visit review of records, face to face time with the patient discussing her conditions and treatments and post visit orders.    Milas Hock, MD, FACOG Obstetrician & Gynecologist, [facility] for [facility], [facility]

## 2021-11-11 NOTE — Progress Notes (Signed)
Last pap- 02/11/20- ASCUS

## 2021-11-14 ENCOUNTER — Encounter: Payer: Self-pay | Admitting: Obstetrics & Gynecology

## 2021-11-14 ENCOUNTER — Ambulatory Visit: Payer: Medicaid Other | Admitting: Obstetrics & Gynecology

## 2021-11-14 ENCOUNTER — Other Ambulatory Visit: Payer: Self-pay

## 2021-11-14 ENCOUNTER — Other Ambulatory Visit (HOSPITAL_COMMUNITY)
Admission: RE | Admit: 2021-11-14 | Discharge: 2021-11-14 | Disposition: A | Discharge status: Home / Self Care | Payer: Medicaid Other | Source: Ambulatory Visit | Attending: Obstetrics & Gynecology | Admitting: Obstetrics & Gynecology

## 2021-11-14 VITALS — BP 129/84 | HR 77 | Resp 16 | Ht 64.0 in | Wt 159.0 lb

## 2021-11-14 DIAGNOSIS — Z01419 Encounter for gynecological examination (general) (routine) without abnormal findings: Secondary | ICD-10-CM

## 2021-11-14 DIAGNOSIS — R5383 Other fatigue: Secondary | ICD-10-CM | POA: Diagnosis not present

## 2021-11-14 NOTE — Progress Notes (Signed)
Subjective:     Sabrina Rogers is a 36 y.o. female here for a routine exam.  Current complaints: feels a little fatigued some days.  Patient had a recent miscarriage and has had 1 period since the miscarriage.  She is psychologically doing fine.   Gynecologic History Patient's last menstrual period was 10/31/2021. Contraception: none Last Pap: 2021. Results were: ASCUS, HPV neg Last mammogram: never.   Obstetric History OB History  Gravida Para Term Preterm AB Living  2 1   1  0 1  SAB IAB Ectopic Multiple Live Births  0     0 1    # Outcome Date GA Lbr Len/2nd Weight Sex Delivery Anes PTL Lv  2 Preterm 11/06/18 [redacted]w[redacted]d 85:01 / 00:38 5 lb 5.7 oz (2.43 kg) F Vag-Spont EPI  LIV  1 Gravida              The following portions of the patient's history were reviewed and updated as appropriate: allergies, current medications, past family history, past medical history, past social history, past surgical history, and problem list.  Review of Systems Pertinent items noted in HPI and remainder of comprehensive ROS otherwise negative.    Objective:     Vitals:   11/14/21 1348  BP: 129/84  Pulse: 77  Resp: 16  Weight: 159 lb (72.1 kg)  Height: 5\' 4"  (1.626 m)   Vitals:  WNL General appearance: alert, cooperative and no distress  HEENT: Normocephalic, without obvious abnormality, atraumatic Eyes: negative Throat: lips, mucosa, and tongue normal; teeth and gums normal  Respiratory: Clear to auscultation bilaterally  CV: Regular rate and rhythm  Breasts:  Normal appearance, no masses or tenderness, no nipple retraction or dimpling  GI: Soft, non-tender; bowel sounds normal; no masses,  no organomegaly  GU: External Genitalia:  Tanner V, no lesion Urethra:  No prolapse   Vagina: Pink, normal rugae, no blood or discharge  Cervix: No CMT, no lesion  Uterus:  Normal size and contour, non tender  Adnexa: Normal, no masses, non tender  Musculoskeletal: No edema, redness or tenderness in  the calves or thighs  Skin: No lesions or rash  Lymphatic: Axillary adenopathy: none     Psychiatric: Normal mood and behavior        Assessment:    Healthy female exam.    Plan:   Pap with co testing [facility] labs Continue PNV as she is trying to conceive.

## 2021-11-16 ENCOUNTER — Other Ambulatory Visit: Payer: Medicaid Other | Admitting: *Deleted

## 2021-11-16 ENCOUNTER — Other Ambulatory Visit: Payer: Self-pay

## 2021-11-16 LAB — CYTOLOGY - PAP
Comment: NEGATIVE
Diagnosis: NEGATIVE
High risk HPV: NEGATIVE

## 2021-11-16 NOTE — Progress Notes (Signed)
Fasting labs only

## 2021-11-17 LAB — HEMOGLOBIN A1C
Hgb A1c MFr Bld: 5.4 % of total Hgb (ref <5.7)
Mean Plasma Glucose: 108 mg/dL
eAG (mmol/L): 6 mmol/L

## 2021-11-17 LAB — LIPID PANEL
Cholesterol: 158 mg/dL (ref <200)
HDL: 49 mg/dL — ABNORMAL LOW (ref >=50)
LDL Cholesterol (Calc): 89 mg/dL (calc)
Non-HDL Cholesterol (Calc): 109 mg/dL (calc) (ref <130)
Total CHOL/HDL Ratio: 3.2 (calc) (ref <5.0)
Triglycerides: 105 mg/dL (ref <150)

## 2021-11-17 LAB — COMPREHENSIVE METABOLIC PANEL
AG Ratio: 2 (calc) (ref 1.0–2.5)
ALT: 56 U/L — ABNORMAL HIGH (ref 6–29)
AST: 34 U/L — ABNORMAL HIGH (ref 10–30)
Albumin: 4.5 g/dL (ref 3.6–5.1)
Alkaline phosphatase (APISO): 54 U/L (ref 31–125)
BUN: 10 mg/dL (ref 7–25)
CO2: 26 mmol/L (ref 20–32)
Calcium: 9.3 mg/dL (ref 8.6–10.2)
Chloride: 107 mmol/L (ref 98–110)
Creat: 0.64 mg/dL (ref 0.50–0.97)
Globulin: 2.2 g/dL (calc) (ref 1.9–3.7)
Glucose, Bld: 112 mg/dL — ABNORMAL HIGH (ref 65–99)
Potassium: 4.1 mmol/L (ref 3.5–5.3)
Sodium: 139 mmol/L (ref 135–146)
Total Bilirubin: 0.6 mg/dL (ref 0.2–1.2)
Total Protein: 6.7 g/dL (ref 6.1–8.1)

## 2021-11-17 LAB — CBC
HCT: 40.9 % (ref 35.0–45.0)
Hemoglobin: 13.6 g/dL (ref 11.7–15.5)
MCH: 29.7 pg (ref 27.0–33.0)
MCHC: 33.3 g/dL (ref 32.0–36.0)
MCV: 89.3 fL (ref 80.0–100.0)
MPV: 11.1 fL (ref 7.5–12.5)
Platelets: 279 10*3/uL (ref 140–400)
RBC: 4.58 10*6/uL (ref 3.80–5.10)
RDW: 12.6 % (ref 11.0–15.0)
WBC: 4.5 10*3/uL (ref 3.8–10.8)

## 2021-11-17 LAB — TSH: TSH: 1 mIU/L

## 2021-12-02 ENCOUNTER — Encounter: Payer: Self-pay | Admitting: Obstetrics & Gynecology

## 2021-12-05 ENCOUNTER — Other Ambulatory Visit: Payer: Self-pay | Admitting: Obstetrics & Gynecology

## 2021-12-05 DIAGNOSIS — R748 Abnormal levels of other serum enzymes: Secondary | ICD-10-CM

## 2021-12-05 NOTE — Progress Notes (Signed)
Repeat CMP ordered for elevated liver enzymes and elevated fasting glucose at last draw.

## 2022-03-01 ENCOUNTER — Other Ambulatory Visit: Payer: Medicaid Other

## 2022-03-01 DIAGNOSIS — R748 Abnormal levels of other serum enzymes: Secondary | ICD-10-CM

## 2022-03-01 NOTE — Progress Notes (Cosign Needed)
Pt here for repeat CMP per Dr Penne Lash ?

## 2022-03-03 ENCOUNTER — Encounter: Payer: Self-pay | Admitting: Obstetrics & Gynecology

## 2022-03-03 DIAGNOSIS — R7301 Impaired fasting glucose: Secondary | ICD-10-CM | POA: Insufficient documentation

## 2022-03-06 LAB — COMPREHENSIVE METABOLIC PANEL
AG Ratio: 1.7 (calc) (ref 1.0–2.5)
ALT: 34 U/L — ABNORMAL HIGH (ref 6–29)
AST: 26 U/L (ref 10–30)
Albumin: 4.5 g/dL (ref 3.6–5.1)
Alkaline phosphatase (APISO): 46 U/L (ref 31–125)
BUN: 10 mg/dL (ref 7–25)
CO2: 24 mmol/L (ref 20–32)
Calcium: 9.3 mg/dL (ref 8.6–10.2)
Chloride: 109 mmol/L (ref 98–110)
Creat: 0.74 mg/dL (ref 0.50–0.97)
Globulin: 2.6 g/dL (calc) (ref 1.9–3.7)
Glucose, Bld: 106 mg/dL — ABNORMAL HIGH (ref 65–99)
Potassium: 4.1 mmol/L (ref 3.5–5.3)
Sodium: 141 mmol/L (ref 135–146)
Total Bilirubin: 0.5 mg/dL (ref 0.2–1.2)
Total Protein: 7.1 g/dL (ref 6.1–8.1)

## 2022-03-06 LAB — HEMOGLOBIN A1C W/OUT EAG

## 2022-03-13 ENCOUNTER — Other Ambulatory Visit: Payer: Medicaid Other

## 2022-07-03 ENCOUNTER — Encounter: Payer: Self-pay | Admitting: *Deleted

## 2022-07-03 ENCOUNTER — Encounter: Payer: Self-pay | Admitting: Obstetrics & Gynecology

## 2022-07-03 ENCOUNTER — Ambulatory Visit: Payer: Medicaid Other | Admitting: Obstetrics & Gynecology

## 2022-07-03 VITALS — BP 133/87 | HR 87 | Resp 16 | Ht 64.0 in | Wt 159.0 lb

## 2022-07-03 DIAGNOSIS — N912 Amenorrhea, unspecified: Secondary | ICD-10-CM

## 2022-07-03 DIAGNOSIS — K5901 Slow transit constipation: Secondary | ICD-10-CM | POA: Diagnosis not present

## 2022-07-03 DIAGNOSIS — K649 Unspecified hemorrhoids: Secondary | ICD-10-CM | POA: Diagnosis not present

## 2022-07-03 DIAGNOSIS — R739 Hyperglycemia, unspecified: Secondary | ICD-10-CM

## 2022-07-03 DIAGNOSIS — Z3200 Encounter for pregnancy test, result unknown: Secondary | ICD-10-CM

## 2022-07-03 LAB — GLUCOSE, POCT (MANUAL RESULT ENTRY): POC Glucose: 164 mg/dl — AB (ref 70–99)

## 2022-07-03 NOTE — Progress Notes (Signed)
   Subjective:    Patient ID: Sabrina Rogers, female    DOB: 1985-11-15, 36 y.o.   MRN: [id number]  HPI  36 yo female with recently positive home pregnancy test.  Last menstrual period was August 7.  She had no bleeding or pelvic pain.  She does have breast tenderness.  Her last pregnancy ended in a miscarriage on 7 weeks.  Patient is having constipation and feels like she has a hemorrhoid.  It was much more painful last week is little better today.  She did have a bowel movement this morning.  Review of Systems  Constitutional: Negative.   Respiratory: Negative.    Cardiovascular: Negative.   Gastrointestinal:  Positive for rectal pain.  Genitourinary:  Negative for pelvic pain, vaginal bleeding and vaginal pain.       Hemorrhoid  Skin: Negative.   Psychiatric/Behavioral: Negative.         Objective:   Physical Exam Vitals reviewed.  Constitutional:      General: She is not in acute distress.    Appearance: She is well-developed.  HENT:     Head: Normocephalic and atraumatic.  Eyes:     Conjunctiva/sclera: Conjunctivae normal.  Cardiovascular:     Rate and Rhythm: Normal rate.  Pulmonary:     Effort: Pulmonary effort is normal.  Abdominal:     General: Abdomen is flat.     Palpations: Abdomen is soft.  Genitourinary:    Comments: Small hemorrhoid.  Non thrombosed.  Skin:    General: Skin is warm and dry.  Neurological:     Mental Status: She is alert and oriented to person, place, and time.  Psychiatric:        Mood and Affect: Mood normal.    Vitals:   07/03/22 0950  BP: 133/87  Pulse: 87  Resp: 16  Weight: 159 lb (72.1 kg)  Height: 5\' 4"  (1.626 m)       Assessment & Plan:  36 yo female G3P0111 with amenorrhea (LMP 05/29/22)    Elevated blood sugar--CBG approx 1 hour pp =164; Pt will start checking blood sugars at home with glucometer--hx of GDM with last pregnancy Beta HCG today and rpt Wednesday; will base first Saturday on results Hemorrhoid--Prep H;  wipes Constipation--squatty potty, colace prn; Miralax if colace doesn't work.   I provided 32 minutes of verbal and non-verbal time during this encounter date, time was needed to gather information, review chart, records, communicate/coordinate with staff, peform history and physical, counseling, as well as complete documentation.

## 2022-07-04 LAB — HCG, QUANTITATIVE, PREGNANCY: HCG, Total, QN: 3147 m[IU]/mL

## 2022-07-06 LAB — HCG, QUANTITATIVE, PREGNANCY: HCG, Total, QN: 6494 m[IU]/mL

## 2022-07-10 ENCOUNTER — Telehealth: Payer: Self-pay | Admitting: *Deleted

## 2022-07-10 NOTE — Telephone Encounter (Signed)
Left patient a detailed message to call the office or respond to MyChart to schedule New OB appointment starting the week of 08/07/2022.

## 2022-07-26 ENCOUNTER — Encounter: Payer: Self-pay | Admitting: Obstetrics & Gynecology

## 2022-07-27 ENCOUNTER — Telehealth: Payer: Self-pay | Admitting: *Deleted

## 2022-07-27 NOTE — Telephone Encounter (Signed)
Left patient a message with billing codes for vag and C/S delivery.

## 2022-08-07 ENCOUNTER — Encounter: Payer: Self-pay | Admitting: Obstetrics and Gynecology

## 2022-08-07 ENCOUNTER — Ambulatory Visit (INDEPENDENT_AMBULATORY_CARE_PROVIDER_SITE_OTHER): Payer: Medicaid Other | Admitting: Obstetrics and Gynecology

## 2022-08-07 ENCOUNTER — Other Ambulatory Visit (HOSPITAL_COMMUNITY)
Admission: RE | Admit: 2022-08-07 | Discharge: 2022-08-07 | Disposition: A | Discharge status: Home / Self Care | Payer: Medicaid Other | Source: Ambulatory Visit | Attending: Obstetrics and Gynecology | Admitting: Obstetrics and Gynecology

## 2022-08-07 ENCOUNTER — Ambulatory Visit (INDEPENDENT_AMBULATORY_CARE_PROVIDER_SITE_OTHER): Payer: Medicaid Other

## 2022-08-07 VITALS — BP 103/99 | HR 88 | Wt 161.0 lb

## 2022-08-07 DIAGNOSIS — Z3480 Encounter for supervision of other normal pregnancy, unspecified trimester: Secondary | ICD-10-CM | POA: Diagnosis not present

## 2022-08-07 DIAGNOSIS — Z3A10 10 weeks gestation of pregnancy: Secondary | ICD-10-CM

## 2022-08-07 DIAGNOSIS — Z8751 Personal history of pre-term labor: Secondary | ICD-10-CM | POA: Diagnosis not present

## 2022-08-07 DIAGNOSIS — O26891 Other specified pregnancy related conditions, first trimester: Secondary | ICD-10-CM

## 2022-08-07 DIAGNOSIS — O24419 Gestational diabetes mellitus in pregnancy, unspecified control: Secondary | ICD-10-CM | POA: Insufficient documentation

## 2022-08-07 DIAGNOSIS — O26899 Other specified pregnancy related conditions, unspecified trimester: Secondary | ICD-10-CM

## 2022-08-07 DIAGNOSIS — Z3481 Encounter for supervision of other normal pregnancy, first trimester: Secondary | ICD-10-CM | POA: Diagnosis not present

## 2022-08-07 DIAGNOSIS — N898 Other specified noninflammatory disorders of vagina: Secondary | ICD-10-CM | POA: Insufficient documentation

## 2022-08-07 DIAGNOSIS — Z8632 Personal history of gestational diabetes: Secondary | ICD-10-CM | POA: Diagnosis not present

## 2022-08-07 NOTE — Progress Notes (Signed)
INITIAL PRENATAL VISIT NOTE  Subjective:  Sabrina Rogers is a 36 y.o. G4P0101 at [redacted]w[redacted]d by LMP being seen today for her initial prenatal visit. She has an obstetric history significant for PROM with subsequent induction at 34 weeks. She has a medical history significant for gDM.  Patient reports no complaints.  Contractions: Not present. Vag. Bleeding: None.  Movement: Absent. Denies leaking of fluid.    Past Medical History:  Diagnosis Date   Gestational diabetes    Vaginal Pap smear, abnormal     Past Surgical History:  Procedure Laterality Date   NO PAST SURGERIES      OB History  Gravida Para Term Preterm AB Living  3 1   1 1 1   SAB IAB Ectopic Multiple Live Births  1     0 1    # Outcome Date GA Lbr Len/2nd Weight Sex Delivery Anes PTL Lv  3 Current           2 Preterm 11/06/18 [redacted]w[redacted]d 85:01 / 00:38 5 lb 5.7 oz (2.43 kg) F Vag-Spont EPI  LIV  1 SAB             Social History   Socioeconomic History   Marital status: Single    Spouse name: Not on file   Number of children: Not on file   Years of education: Not on file   Highest education level: Not on file  Occupational History   Not on file  Tobacco Use   Smoking status: Never   Smokeless tobacco: Never  Vaping Use   Vaping Use: Never used  Substance and Sexual Activity   Alcohol use: Never   Drug use: Never   Sexual activity: Yes    Birth control/protection: None  Other Topics Concern   Not on file  Social History Narrative   Not on file   Social Determinants of Health   Financial Resource Strain: Not on file  Food Insecurity: Not on file  Transportation Needs: Not on file  Physical Activity: Not on file  Stress: Not on file  Social Connections: Not on file    Family History  Problem Relation Age of Onset   Hypertension Mother    Hypothyroidism Mother    Hypertension Father      Current Outpatient Medications:    Prenatal Vit-Fe Fumarate-FA (MULTIVITAMIN-PRENATAL) 27-0.8 MG TABS  tablet, Take 1 tablet by mouth daily at 12 noon., Disp: , Rfl:   No Known Allergies  Review of Systems: Negative except for what is mentioned in HPI.  Objective:   Vitals:   08/07/22 1104  BP: (!) 103/99  Pulse: 88  Weight: 161 lb (73 kg)    Fetal Status:     Movement: Absent     Physical Exam: BP (!) 103/99   Pulse 88   Wt 161 lb (73 kg)   LMP 05/29/2022   BMI 27.64 kg/m  CONSTITUTIONAL: Well-developed, well-nourished female in no acute distress.  NEUROLOGIC: Alert and oriented to person, place, and time. Normal reflexes, muscle tone coordination. No cranial nerve deficit noted. PSYCHIATRIC: Normal mood and affect. Normal behavior. Normal judgment and thought content. SKIN: Skin is warm and dry. No rash noted. Not diaphoretic. No erythema. No pallor. HENT:  Normocephalic, atraumatic, External right and left ear normal. Oropharynx is clear and moist EYES: Conjunctivae and EOM are normal. Pupils are equal, round, and reactive to light. No scleral icterus.  NECK: Normal range of motion, supple, no masses CARDIOVASCULAR: Normal heart rate noted,  regular rhythm RESPIRATORY: Effort and breath sounds normal, no problems with respiration noted BREASTS: deferred ABDOMEN: Soft, nontender, nondistended, gravid. GU: normal appearing external female genitalia, multiparous normal appearing cervix, scant white discharge in vagina, no lesions noted Bimanual: 10 weeks sized uterus, no adnexal tenderness or palpable lesions noted MUSCULOSKELETAL: Normal range of motion. EXT:  No edema and no tenderness. 2+ distal pulses.   Assessment and Plan:  Pregnancy: G4P0101 at [redacted]w[redacted]d by LMP  1. Supervision of other normal pregnancy, antepartum Reviewed Center for [facility] structure, multiple providers, fellows, medical students, virtual visits, MyChart.  - Culture, OB Urine - GC/Chlamydia probe amp ([place])not at [facility] - Hepatitis C antibody - HIV Antibody (routine testing  w rflx) - Obstetric panel - US OB Limited; Future - PANORAMA PRENATAL TEST FULL PANEL - Glucose tolerance, 2 hours  2. Vaginal discharge during pregnancy, antepartum - Cervicovaginal ancillary only( [place])  3. History of preterm delivery - reviewed 170HP off market, no clear recommendations for vaginal progesterone - f/u with MFM for 16 weeks CL - [place] MFM OB Transvaginal; Future  4. History of gestational diabetes Early 2 hr GTT  Preterm labor symptoms and general obstetric precautions including but not limited to vaginal bleeding, contractions, leaking of fluid and fetal movement were reviewed in detail with the patient.  Please refer to After Visit Summary for other counseling recommendations.   Return in about 4 weeks (around 09/04/2022) for high OB.  Sloan Leiter 08/07/2022 1:02 PM

## 2022-08-08 LAB — GC/CHLAMYDIA PROBE AMP (~~LOC~~) NOT AT ARMC
Chlamydia: NEGATIVE
Comment: NEGATIVE
Comment: NORMAL
Neisseria Gonorrhea: NEGATIVE

## 2022-08-08 LAB — CERVICOVAGINAL ANCILLARY ONLY
Bacterial Vaginitis (gardnerella): NEGATIVE
Candida Glabrata: NEGATIVE
Candida Vaginitis: NEGATIVE
Comment: NEGATIVE
Comment: NEGATIVE
Comment: NEGATIVE

## 2022-08-09 LAB — URINE CULTURE, OB REFLEX

## 2022-08-09 LAB — CULTURE, OB URINE

## 2022-08-10 ENCOUNTER — Encounter: Payer: Self-pay | Admitting: Obstetrics and Gynecology

## 2022-08-14 ENCOUNTER — Other Ambulatory Visit: Payer: Self-pay

## 2022-08-14 DIAGNOSIS — Z3480 Encounter for supervision of other normal pregnancy, unspecified trimester: Secondary | ICD-10-CM

## 2022-08-14 DIAGNOSIS — Z8632 Personal history of gestational diabetes: Secondary | ICD-10-CM

## 2022-08-15 ENCOUNTER — Encounter: Payer: Self-pay | Admitting: Obstetrics and Gynecology

## 2022-08-15 LAB — OBSTETRIC PANEL
Absolute Monocytes: 350 cells/uL (ref 200–950)
Antibody Screen: NOT DETECTED
Basophils Absolute: 40 cells/uL (ref 0–200)
Basophils Relative: 0.6 %
Eosinophils Absolute: 132 cells/uL (ref 15–500)
Eosinophils Relative: 2 %
HCT: 38 % (ref 35.0–45.0)
Hemoglobin: 12.6 g/dL (ref 11.7–15.5)
Hepatitis B Surface Ag: NONREACTIVE
Lymphs Abs: 1894 cells/uL (ref 850–3900)
MCH: 30.3 pg (ref 27.0–33.0)
MCHC: 33.2 g/dL (ref 32.0–36.0)
MCV: 91.3 fL (ref 80.0–100.0)
MPV: 10.6 fL (ref 7.5–12.5)
Monocytes Relative: 5.3 %
Neutro Abs: 4184 cells/uL (ref 1500–7800)
Neutrophils Relative %: 63.4 %
Platelets: 294 10*3/uL (ref 140–400)
RBC: 4.16 10*6/uL (ref 3.80–5.10)
RDW: 12.2 % (ref 11.0–15.0)
RPR Ser Ql: NONREACTIVE
Rubella: 8.56 Index
Total Lymphocyte: 28.7 %
WBC: 6.6 10*3/uL (ref 3.8–10.8)

## 2022-08-15 LAB — 2HR GTT W 1 HR, CARPENTER, 75 G
Glucose, 1 Hr, Gest: 161 mg/dL (ref 65–179)
Glucose, 2 Hr, Gest: 141 mg/dL (ref 65–152)
Glucose, Fasting, Gest: 90 mg/dL (ref 65–91)

## 2022-08-15 LAB — HEPATITIS C ANTIBODY: Hepatitis C Ab: NONREACTIVE

## 2022-08-15 LAB — HIV ANTIBODY (ROUTINE TESTING W REFLEX): HIV 1&2 Ab, 4th Generation: NONREACTIVE

## 2022-08-17 LAB — PANORAMA PRENATAL TEST FULL PANEL:PANORAMA TEST PLUS 5 ADDITIONAL MICRODELETIONS

## 2022-08-21 ENCOUNTER — Encounter: Payer: Self-pay | Admitting: Obstetrics and Gynecology

## 2022-09-08 ENCOUNTER — Encounter: Payer: Medicaid Other | Admitting: Certified Nurse Midwife

## 2022-09-08 ENCOUNTER — Telehealth: Payer: Self-pay | Admitting: *Deleted

## 2022-09-08 NOTE — Telephone Encounter (Signed)
Left patient an urgent message to call and reschedule missed appointment on 09/08/2022 or if she is going to a different office.

## 2022-09-10 ENCOUNTER — Encounter: Payer: Self-pay | Admitting: Obstetrics & Gynecology

## 2022-09-18 ENCOUNTER — Other Ambulatory Visit: Payer: Medicaid Other

## 2022-09-18 ENCOUNTER — Ambulatory Visit: Payer: Medicaid Other

## 2022-10-02 ENCOUNTER — Encounter: Payer: Self-pay | Admitting: Obstetrics and Gynecology

## 2022-10-02 ENCOUNTER — Ambulatory Visit (INDEPENDENT_AMBULATORY_CARE_PROVIDER_SITE_OTHER): Payer: Medicaid Other | Admitting: Obstetrics and Gynecology

## 2022-10-02 VITALS — BP 128/81 | HR 75 | Wt 163.0 lb

## 2022-10-02 DIAGNOSIS — Z3480 Encounter for supervision of other normal pregnancy, unspecified trimester: Secondary | ICD-10-CM

## 2022-10-02 DIAGNOSIS — Z8632 Personal history of gestational diabetes: Secondary | ICD-10-CM

## 2022-10-02 DIAGNOSIS — Z8751 Personal history of pre-term labor: Secondary | ICD-10-CM

## 2022-10-02 DIAGNOSIS — Z3482 Encounter for supervision of other normal pregnancy, second trimester: Secondary | ICD-10-CM

## 2022-10-02 DIAGNOSIS — Z3A18 18 weeks gestation of pregnancy: Secondary | ICD-10-CM

## 2022-10-02 MED ORDER — ASPIRIN 81 MG PO TBEC: 81.0000 mg | DELAYED_RELEASE_TABLET | Freq: Every day | ORAL | 12 refills | Status: DC

## 2022-10-02 NOTE — Progress Notes (Signed)
   PRENATAL VISIT NOTE  Subjective:  Sabrina Rogers is a 36 y.o. [phone] at [redacted]w[redacted]d being seen today for ongoing prenatal care.  She is currently monitored for the following issues for this high-risk pregnancy and has ASCUS with positive high risk HPV cervical; History of preterm delivery; Abnormal fasting glucose; Supervision of other normal pregnancy, antepartum; and History of gestational diabetes on their problem list.  Patient reports  feeling flutters .  Contractions: Not present. Vag. Bleeding: None.  Movement: Present. Denies leaking of fluid.   The following portions of the patient's history were reviewed and updated as appropriate: allergies, current medications, past family history, past medical history, past social history, past surgical history and problem list.   Objective:   Vitals:   10/02/22 0900  BP: 128/81  Pulse: 75  Weight: 163 lb (73.9 kg)   Fetal Status: Fetal Heart Rate (bpm): 154   Movement: Present     General:  Alert, oriented and cooperative. Patient is in no acute distress.  Skin: Skin is warm and dry. No rash noted.   Cardiovascular: Normal heart rate noted  Respiratory: Normal respiratory effort, no problems with respiration noted  Abdomen: Soft, gravid, appropriate for gestational age.  Pain/Pressure: Absent     Pelvic: Cervical exam deferred        Extremities: Normal range of motion.  Edema: None  Mental Status: Normal mood and affect. Normal behavior. Normal judgment and thought content.   Assessment and Plan:  Pregnancy: G3P0111 at [redacted]w[redacted]d  1. [redacted] weeks gestation of pregnancy  2. History of preterm delivery To MFM for cervical length, currently scheduled for 22 weeks due to insurance issue, will get in ASAP  3. Supervision of other normal pregnancy, antepartum Anatomy ordered today  4. History of gestational diabetes Early 2 hr GTT normal Start baby Aspirin   Preterm labor symptoms and general obstetric precautions including but not limited  to vaginal bleeding, contractions, leaking of fluid and fetal movement were reviewed in detail with the patient. Please refer to After Visit Summary for other counseling recommendations.   Return in about 4 weeks (around 10/30/2022) for high OB.  Future Appointments  Date Time Provider Department Center  10/30/2022  9:30 AM Myna Hidalgo, DO [facility]  10/31/2022  8:15 AM [facility] NURSE [facility]  10/31/2022  8:30 AM [facility] US2 [facility]  11/08/2022  2:15 PM [facility] NURSE [facility]  11/08/2022  2:30 PM [facility] US2 [facility]    Conan Bowens, MD

## 2022-10-03 ENCOUNTER — Encounter: Payer: Self-pay | Admitting: *Deleted

## 2022-10-03 ENCOUNTER — Telehealth: Payer: Self-pay | Admitting: Obstetrics and Gynecology

## 2022-10-03 LAB — ALPHA FETOPROTEIN, MATERNAL
AFP MoM: 1.16
AFP, Serum: 46.3 ng/mL
Calc'd Gestational Age: 18 weeks
Maternal Wt: 163 [lb_av]
Risk for ONTD: <1
Twins-AFP: 1

## 2022-10-04 ENCOUNTER — Telehealth: Payer: Self-pay | Admitting: Obstetrics and Gynecology

## 2022-10-05 ENCOUNTER — Ambulatory Visit: Payer: Medicaid Other

## 2022-10-05 ENCOUNTER — Other Ambulatory Visit: Payer: Medicaid Other

## 2022-10-11 ENCOUNTER — Ambulatory Visit: Payer: Medicaid Other | Attending: Obstetrics and Gynecology | Admitting: *Deleted

## 2022-10-11 ENCOUNTER — Ambulatory Visit (HOSPITAL_BASED_OUTPATIENT_CLINIC_OR_DEPARTMENT_OTHER): Payer: Medicaid Other

## 2022-10-11 ENCOUNTER — Encounter: Payer: Self-pay | Admitting: *Deleted

## 2022-10-11 ENCOUNTER — Other Ambulatory Visit: Payer: Self-pay | Admitting: *Deleted

## 2022-10-11 VITALS — BP 124/64 | HR 69

## 2022-10-11 DIAGNOSIS — O09293 Supervision of pregnancy with other poor reproductive or obstetric history, third trimester: Secondary | ICD-10-CM | POA: Insufficient documentation

## 2022-10-11 DIAGNOSIS — O24113 Pre-existing diabetes mellitus, type 2, in pregnancy, third trimester: Secondary | ICD-10-CM | POA: Diagnosis not present

## 2022-10-11 DIAGNOSIS — O321XX0 Maternal care for breech presentation, not applicable or unspecified: Secondary | ICD-10-CM | POA: Diagnosis present

## 2022-10-11 DIAGNOSIS — O09899 Supervision of other high risk pregnancies, unspecified trimester: Secondary | ICD-10-CM

## 2022-10-11 DIAGNOSIS — Z3A19 19 weeks gestation of pregnancy: Secondary | ICD-10-CM | POA: Diagnosis not present

## 2022-10-11 DIAGNOSIS — O09292 Supervision of pregnancy with other poor reproductive or obstetric history, second trimester: Secondary | ICD-10-CM | POA: Diagnosis not present

## 2022-10-11 DIAGNOSIS — O09212 Supervision of pregnancy with history of pre-term labor, second trimester: Secondary | ICD-10-CM

## 2022-10-11 DIAGNOSIS — Z362 Encounter for other antenatal screening follow-up: Secondary | ICD-10-CM

## 2022-10-11 DIAGNOSIS — Z8751 Personal history of pre-term labor: Secondary | ICD-10-CM

## 2022-10-11 DIAGNOSIS — Z3A34 34 weeks gestation of pregnancy: Secondary | ICD-10-CM | POA: Insufficient documentation

## 2022-10-11 DIAGNOSIS — Z3480 Encounter for supervision of other normal pregnancy, unspecified trimester: Secondary | ICD-10-CM

## 2022-10-11 DIAGNOSIS — Z363 Encounter for antenatal screening for malformations: Secondary | ICD-10-CM | POA: Diagnosis not present

## 2022-10-11 DIAGNOSIS — Z3689 Encounter for other specified antenatal screening: Secondary | ICD-10-CM

## 2022-10-23 NOTE — L&D Delivery Note (Signed)
Delivery Note 37 y.o. [id number] at [redacted]w[redacted]d admitted for IOL due to gHTN, A2GDM.   At 10:16 PM a viable female was delivered via Vaginal, Spontaneous (Presentation: Right Occiput Anterior).  APGAR: 7, 8; weight 6 lb 11.9 oz (3060 g).  Nuchal cord X 1 noted and reduced after delivery of the baby. Placenta status: Spontaneous, Intact.  Cord: 3 vessels with the following complications: None.  Cord pH: not collected  Anesthesia: Epidural Episiotomy: None Lacerations: 1st degree;Periurethral Suture Repair:  4-0 monocryl for periurethral laceration, just inferior to the clitoris Est. Blood Loss (mL): 422 ml  Mom to postpartum.  Baby to Couplet care / Skin to Skin.  Sheppard Evens MD MPH OB Fellow, Faculty Practice [facility], Center for [facility] 02/14/2023

## 2022-10-30 ENCOUNTER — Ambulatory Visit (INDEPENDENT_AMBULATORY_CARE_PROVIDER_SITE_OTHER): Payer: Medicaid Other | Admitting: Obstetrics & Gynecology

## 2022-10-30 ENCOUNTER — Encounter: Payer: Self-pay | Admitting: Obstetrics & Gynecology

## 2022-10-30 VITALS — BP 141/76 | HR 85 | Wt 167.0 lb

## 2022-10-30 DIAGNOSIS — Z3A22 22 weeks gestation of pregnancy: Secondary | ICD-10-CM

## 2022-10-30 DIAGNOSIS — Z3480 Encounter for supervision of other normal pregnancy, unspecified trimester: Secondary | ICD-10-CM

## 2022-10-30 DIAGNOSIS — O162 Unspecified maternal hypertension, second trimester: Secondary | ICD-10-CM

## 2022-10-30 DIAGNOSIS — Z3482 Encounter for supervision of other normal pregnancy, second trimester: Secondary | ICD-10-CM

## 2022-10-30 NOTE — Progress Notes (Signed)
   PRENATAL VISIT NOTE  Subjective:  Sabrina Rogers is a 37 y.o. [phone] at [redacted]w[redacted]d being seen today for ongoing prenatal care.  She is currently monitored for the following issues for this high-risk pregnancy and has ASCUS with positive high risk HPV cervical; History of preterm delivery; Abnormal fasting glucose; Supervision of other normal pregnancy, antepartum; and History of gestational diabetes on their problem list.  Patient reports no complaints.  Contractions: Not present. Vag. Bleeding: None.  Movement: Present. Denies leaking of fluid.   The following portions of the patient's history were reviewed and updated as appropriate: allergies, current medications, past family history, past medical history, past social history, past surgical history and problem list.   Objective:   Vitals:   10/30/22 0918  BP: (!) 145/78  Pulse: 85  Weight: 167 lb (75.8 kg)    Fetal Status: Fetal Heart Rate (bpm): 151   Movement: Present     General:  Alert, oriented and cooperative. Patient is in no acute distress.  Skin: Skin is warm and dry. No rash noted.   Cardiovascular: Normal heart rate noted  Respiratory: Normal respiratory effort, no problems with respiration noted  Abdomen: Soft, gravid, appropriate for gestational age.  Pain/Pressure: Absent     Pelvic: Cervical exam deferred        Extremities: Normal range of motion.  Edema: None  Mental Status: Normal mood and affect. Normal behavior. Normal judgment and thought content.   Assessment and Plan:  Pregnancy: G3P0111 at [redacted]w[redacted]d 1. Supervision of other normal pregnancy, antepartum -SUA--serial growth [place] with MFM -Baby Rx APP given today (not optimized schedule)  2.  Elevated BP--this was first time patient had elevated blood pressure.  We took it several times and was still 140s over 90s.  Will send labs today.  Patient states she finished a cup of coffee before coming in however we will still be following her closely to make sure she is  not developing early gestational hypertension.  Take BP at home daily and send through my chart. If elevated, we will see her sooner than 11/22/2022.  Preterm labor symptoms and general obstetric precautions including but not limited to vaginal bleeding, contractions, leaking of fluid and fetal movement were reviewed in detail with the patient. Please refer to After Visit Summary for other counseling recommendations.   No follow-ups on file.  Future Appointments  Date Time Provider [place]  11/22/2022  9:30 AM [facility] NURSE [facility]  11/22/2022  9:45 AM [facility] US7 [facility]    Silas [place], MD

## 2022-10-31 ENCOUNTER — Ambulatory Visit: Payer: Medicaid Other

## 2022-10-31 ENCOUNTER — Encounter: Payer: Self-pay | Admitting: Obstetrics & Gynecology

## 2022-10-31 ENCOUNTER — Other Ambulatory Visit: Payer: Medicaid Other

## 2022-10-31 LAB — CBC
Hematocrit: 33.3 % — ABNORMAL LOW (ref 34.0–46.6)
Hemoglobin: 11.3 g/dL (ref 11.1–15.9)
MCH: 30.7 pg (ref 26.6–33.0)
MCHC: 33.9 g/dL (ref 31.5–35.7)
MCV: 91 fL (ref 79–97)
Platelets: 265 10*3/uL (ref 150–450)
RBC: 3.68 x10E6/uL — ABNORMAL LOW (ref 3.77–5.28)
RDW: 12.1 % (ref 11.7–15.4)
WBC: 6.5 10*3/uL (ref 3.4–10.8)

## 2022-10-31 LAB — COMPREHENSIVE METABOLIC PANEL
ALT: 12 IU/L (ref 0–32)
AST: 17 IU/L (ref 0–40)
Albumin/Globulin Ratio: 1.7 (ref 1.2–2.2)
Albumin: 3.9 g/dL (ref 3.9–4.9)
Alkaline Phosphatase: 57 IU/L (ref 44–121)
BUN/Creatinine Ratio: 15 (ref 9–23)
BUN: 7 mg/dL (ref 6–20)
Bilirubin Total: <0.2 mg/dL (ref 0.0–1.2)
CO2: 19 mmol/L — ABNORMAL LOW (ref 20–29)
Calcium: 9.4 mg/dL (ref 8.7–10.2)
Chloride: 104 mmol/L (ref 96–106)
Creatinine, Ser: 0.48 mg/dL — ABNORMAL LOW (ref 0.57–1.00)
Globulin, Total: 2.3 g/dL (ref 1.5–4.5)
Glucose: 111 mg/dL — ABNORMAL HIGH (ref 70–99)
Potassium: 3.8 mmol/L (ref 3.5–5.2)
Sodium: 137 mmol/L (ref 134–144)
Total Protein: 6.2 g/dL (ref 6.0–8.5)
eGFR: 127 mL/min/{1.73_m2} (ref >59)

## 2022-10-31 LAB — PROTEIN / CREATININE RATIO, URINE
Creatinine, Urine: 123.9 mg/dL
Protein, Ur: 10.7 mg/dL
Protein/Creat Ratio: 86 mg/g creat (ref 0–200)

## 2022-11-01 ENCOUNTER — Encounter: Payer: Self-pay | Admitting: Obstetrics & Gynecology

## 2022-11-01 ENCOUNTER — Telehealth: Payer: Self-pay | Admitting: *Deleted

## 2022-11-01 NOTE — Telephone Encounter (Cosign Needed)
Pt sent in her daily BP today of 125/71 at 2:25pm .  She is to continue taking her daily BP.  Yesterday's BP was 129/76,127/81.

## 2022-11-08 ENCOUNTER — Ambulatory Visit: Payer: Medicaid Other

## 2022-11-08 ENCOUNTER — Encounter: Payer: Self-pay | Admitting: Obstetrics & Gynecology

## 2022-11-08 ENCOUNTER — Other Ambulatory Visit: Payer: Medicaid Other

## 2022-11-13 ENCOUNTER — Telehealth: Payer: Self-pay | Admitting: *Deleted

## 2022-11-13 ENCOUNTER — Encounter: Payer: Self-pay | Admitting: Obstetrics & Gynecology

## 2022-11-13 NOTE — Telephone Encounter (Signed)
BP for 1/22 Received: Today [place], [place] Phone Number: [phone]   120/82

## 2022-11-14 ENCOUNTER — Encounter: Payer: Self-pay | Admitting: *Deleted

## 2022-11-14 ENCOUNTER — Encounter: Payer: Self-pay | Admitting: Obstetrics & Gynecology

## 2022-11-14 NOTE — Progress Notes (Unsigned)
BP sent through my chart 118/94

## 2022-11-22 ENCOUNTER — Ambulatory Visit: Payer: Medicaid Other | Attending: Maternal & Fetal Medicine

## 2022-11-22 ENCOUNTER — Encounter: Payer: Self-pay | Admitting: *Deleted

## 2022-11-22 ENCOUNTER — Other Ambulatory Visit: Payer: Self-pay | Admitting: *Deleted

## 2022-11-22 ENCOUNTER — Ambulatory Visit: Payer: Medicaid Other | Admitting: *Deleted

## 2022-11-22 VITALS — BP 124/81 | HR 88

## 2022-11-22 DIAGNOSIS — Z3689 Encounter for other specified antenatal screening: Secondary | ICD-10-CM | POA: Diagnosis present

## 2022-11-22 DIAGNOSIS — O09892 Supervision of other high risk pregnancies, second trimester: Secondary | ICD-10-CM | POA: Diagnosis not present

## 2022-11-22 DIAGNOSIS — O09899 Supervision of other high risk pregnancies, unspecified trimester: Secondary | ICD-10-CM | POA: Diagnosis not present

## 2022-11-22 DIAGNOSIS — O09212 Supervision of pregnancy with history of pre-term labor, second trimester: Secondary | ICD-10-CM

## 2022-11-22 DIAGNOSIS — Z362 Encounter for other antenatal screening follow-up: Secondary | ICD-10-CM | POA: Insufficient documentation

## 2022-11-22 DIAGNOSIS — Z3A25 25 weeks gestation of pregnancy: Secondary | ICD-10-CM

## 2022-11-22 DIAGNOSIS — O09299 Supervision of pregnancy with other poor reproductive or obstetric history, unspecified trimester: Secondary | ICD-10-CM

## 2022-11-22 DIAGNOSIS — O09292 Supervision of pregnancy with other poor reproductive or obstetric history, second trimester: Secondary | ICD-10-CM

## 2022-11-27 ENCOUNTER — Ambulatory Visit (INDEPENDENT_AMBULATORY_CARE_PROVIDER_SITE_OTHER): Payer: Medicaid Other | Admitting: Obstetrics & Gynecology

## 2022-11-27 VITALS — BP 133/90 | HR 83 | Wt 171.0 lb

## 2022-11-27 DIAGNOSIS — Z3A26 26 weeks gestation of pregnancy: Secondary | ICD-10-CM

## 2022-11-27 DIAGNOSIS — Z3482 Encounter for supervision of other normal pregnancy, second trimester: Secondary | ICD-10-CM

## 2022-11-27 DIAGNOSIS — Z3480 Encounter for supervision of other normal pregnancy, unspecified trimester: Secondary | ICD-10-CM

## 2022-11-27 NOTE — Progress Notes (Signed)
   PRENATAL VISIT NOTE  Subjective:  Sabrina Rogers is a 37 y.o. [phone] at [redacted]w[redacted]d being seen today for ongoing prenatal care.  She is currently monitored for the following issues for this high-risk pregnancy and has ASCUS with positive high risk HPV cervical; History of preterm delivery; Abnormal fasting glucose; Supervision of other normal pregnancy, antepartum; and History of gestational diabetes on their problem list.  Patient reports heartburn.  Contractions: Not present. Vag. Bleeding: None.  Movement: Present. Denies leaking of fluid.   The following portions of the patient's history were reviewed and updated as appropriate: allergies, current medications, past family history, past medical history, past social history, past surgical history and problem list.   Objective:   Vitals:   11/27/22 1025  BP: (!) 133/90  Pulse: 83  Weight: 171 lb (77.6 kg)    Fetal Status: Fetal Heart Rate (bpm): 154   Movement: Present     General:  Alert, oriented and cooperative. Patient is in no acute distress.  Skin: Skin is warm and dry. No rash noted.   Cardiovascular: Normal heart rate noted  Respiratory: Normal respiratory effort, no problems with respiration noted  Abdomen: Soft, gravid, appropriate for gestational age.  Pain/Pressure: Absent     Pelvic: Cervical exam deferred        Extremities: Normal range of motion.     Mental Status: Normal mood and affect. Normal behavior. Normal judgment and thought content.   Assessment and Plan:  Pregnancy: [id number] at [redacted]w[redacted]d 1. Supervision of other normal pregnancy, antepartum Needs 28 week labs--will come in next 2 weeks FN visit for BP and labs  2.  Elevated BP--? White coat HTN?   Take BP twice a day and submit.  BPs at home are normal.  Labs earlier this month are normal.  Bring BP cuff when she comes for 28 week labs to make sure functioning correctly. Continue to take BP twice daily and report readings >140/90  Preterm labor symptoms and  general obstetric precautions including but not limited to vaginal bleeding, contractions, leaking of fluid and fetal movement were reviewed in detail with the patient. Please refer to After Visit Summary for other counseling recommendations.   No follow-ups on file.  Future Appointments  Date Time Provider [place]  12/29/2022 12:30 PM [facility] NURSE [facility]  12/29/2022 12:45 PM [facility] US4 [facility] [place]    Silas Sacramento, MD

## 2022-12-11 ENCOUNTER — Ambulatory Visit (INDEPENDENT_AMBULATORY_CARE_PROVIDER_SITE_OTHER): Payer: Medicaid Other | Admitting: Obstetrics & Gynecology

## 2022-12-11 VITALS — BP 123/76 | HR 89 | Wt 170.0 lb

## 2022-12-11 DIAGNOSIS — Z3A28 28 weeks gestation of pregnancy: Secondary | ICD-10-CM

## 2022-12-11 DIAGNOSIS — Z23 Encounter for immunization: Secondary | ICD-10-CM | POA: Diagnosis not present

## 2022-12-11 DIAGNOSIS — O09893 Supervision of other high risk pregnancies, third trimester: Secondary | ICD-10-CM

## 2022-12-11 DIAGNOSIS — Z8751 Personal history of pre-term labor: Secondary | ICD-10-CM

## 2022-12-11 DIAGNOSIS — O09899 Supervision of other high risk pregnancies, unspecified trimester: Secondary | ICD-10-CM

## 2022-12-11 DIAGNOSIS — Z3480 Encounter for supervision of other normal pregnancy, unspecified trimester: Secondary | ICD-10-CM

## 2022-12-11 NOTE — Progress Notes (Signed)
   PRENATAL VISIT NOTE  Subjective:  Sabrina Rogers is a 37 y.o. [phone] at 46w0dbeing seen today for ongoing prenatal care.  She is currently monitored for the following issues for this low-risk pregnancy and has ASCUS with positive high risk HPV cervical; History of preterm delivery; Abnormal fasting glucose; Supervision of other normal pregnancy, antepartum; and History of gestational diabetes on their problem list.  Patient reports no complaints.  Contractions: Not present. Vag. Bleeding: None.  Movement: Present. Denies leaking of fluid.   The following portions of the patient's history were reviewed and updated as appropriate: allergies, current medications, past family history, past medical history, past social history, past surgical history and problem list.   Objective:   Vitals:   12/11/22 0818  BP: 123/76  Pulse: 89  Weight: 170 lb (77.1 kg)    Fetal Status: Fetal Heart Rate (bpm): 148   Movement: Present     General:  Alert, oriented and cooperative. Patient is in no acute distress.  Skin: Skin is warm and dry. No rash noted.   Cardiovascular: Normal heart rate noted  Respiratory: Normal respiratory effort, no problems with respiration noted  Abdomen: Soft, gravid, appropriate for gestational age.  Pain/Pressure: Absent     Pelvic: Cervical exam deferred        Extremities: Normal range of motion.  Edema: None  Mental Status: Normal mood and affect. Normal behavior. Normal judgment and thought content.   Assessment and Plan:  Pregnancy: [id number] 257w0d. Supervision of other normal pregnancy, antepartum - Glucose Tolerance, 2 Hours w/1 Hour - HIV antibody (with reflex) - CBC - RPR - Tdap vaccine greater than or equal to 7yo IM  2. History of preterm delivery No contractions or change in discharge  3.  SUA MFM grown scans q 4 weeks  4.  BP Sabrina Rogers brought in her cuff--readings vary 15 points systolic between reading. Our BP today is normal  Keep monitoring  at home  Preterm labor symptoms and general obstetric precautions including but not limited to vaginal bleeding, contractions, leaking of fluid and fetal movement were reviewed in detail with the patient. Please refer to After Visit Summary for other counseling recommendations.   No follow-ups on file.  Future Appointments  Date Time Provider [place]3/05/2023 12:30 PM [facility]/05/2023 12:45 PM [facility] US4 [facility] [place]  KeSilas SacramentoMD

## 2022-12-12 ENCOUNTER — Other Ambulatory Visit: Payer: Self-pay

## 2022-12-12 DIAGNOSIS — O24419 Gestational diabetes mellitus in pregnancy, unspecified control: Secondary | ICD-10-CM

## 2022-12-12 LAB — CBC
Hematocrit: 35 % (ref 34.0–46.6)
Hemoglobin: 11.8 g/dL (ref 11.1–15.9)
MCH: 30.3 pg (ref 26.6–33.0)
MCHC: 33.7 g/dL (ref 31.5–35.7)
MCV: 90 fL (ref 79–97)
Platelets: 223 10*3/uL (ref 150–450)
RBC: 3.9 x10E6/uL (ref 3.77–5.28)
RDW: 12.4 % (ref 11.7–15.4)
WBC: 5.9 10*3/uL (ref 3.4–10.8)

## 2022-12-12 LAB — RPR: RPR Ser Ql: NONREACTIVE

## 2022-12-12 LAB — HIV ANTIBODY (ROUTINE TESTING W REFLEX): HIV Screen 4th Generation wRfx: NONREACTIVE

## 2022-12-12 LAB — GLUCOSE TOLERANCE, 2 HOURS W/ 1HR
Glucose, 1 hour: 197 mg/dL — ABNORMAL HIGH (ref 70–179)
Glucose, 2 hour: 136 mg/dL (ref 70–152)
Glucose, Fasting: 88 mg/dL (ref 70–91)

## 2022-12-12 MED ORDER — BLOOD GLUCOSE MONITOR KIT: PACK | 0 refills | Status: DC

## 2022-12-25 ENCOUNTER — Ambulatory Visit (INDEPENDENT_AMBULATORY_CARE_PROVIDER_SITE_OTHER): Payer: Medicaid Other | Admitting: Obstetrics and Gynecology

## 2022-12-25 VITALS — BP 115/73 | HR 76 | Wt 169.0 lb

## 2022-12-25 DIAGNOSIS — R87810 Cervical high risk human papillomavirus (HPV) DNA test positive: Secondary | ICD-10-CM

## 2022-12-25 DIAGNOSIS — O09523 Supervision of elderly multigravida, third trimester: Secondary | ICD-10-CM

## 2022-12-25 DIAGNOSIS — Z8751 Personal history of pre-term labor: Secondary | ICD-10-CM

## 2022-12-25 DIAGNOSIS — Z3480 Encounter for supervision of other normal pregnancy, unspecified trimester: Secondary | ICD-10-CM

## 2022-12-25 DIAGNOSIS — Z3A30 30 weeks gestation of pregnancy: Secondary | ICD-10-CM

## 2022-12-25 DIAGNOSIS — O24419 Gestational diabetes mellitus in pregnancy, unspecified control: Secondary | ICD-10-CM

## 2022-12-25 DIAGNOSIS — O24415 Gestational diabetes mellitus in pregnancy, controlled by oral hypoglycemic drugs: Secondary | ICD-10-CM

## 2022-12-25 DIAGNOSIS — R87610 Atypical squamous cells of undetermined significance on cytologic smear of cervix (ASC-US): Secondary | ICD-10-CM

## 2022-12-25 NOTE — Progress Notes (Signed)
   PRENATAL VISIT NOTE  Subjective:  Sabrina Rogers is a 37 y.o. [phone] at 29w0dbeing seen today for ongoing prenatal care.  She is currently monitored for the following issues for this high-risk pregnancy and has ASCUS with positive high risk HPV cervical; History of preterm delivery; Supervision of other normal pregnancy, antepartum; GDM (gestational diabetes mellitus); and Multigravida of advanced maternal age in third trimester on their problem list.  Patient reports  doing well overall .  Contractions: Not present. Vag. Bleeding: None.  Movement: Present. Denies leaking of fluid.   The following portions of the patient's history were reviewed and updated as appropriate: allergies, current medications, past family history, past medical history, past social history, past surgical history and problem list.   Objective:   Vitals:   12/25/22 1047  BP: 115/73  Pulse: 76  Weight: 169 lb (76.7 kg)    Fetal Status: Fetal Heart Rate (bpm): 141   Movement: Present     General:  Alert, oriented and cooperative. Patient is in no acute distress.  Skin: Skin is warm and dry. No rash noted.   Cardiovascular: Normal heart rate noted  Respiratory: Normal respiratory effort, no problems with respiration noted  Abdomen: Soft, gravid, appropriate for gestational age.  Pain/Pressure: Absent      Assessment and Plan:  Pregnancy: [id number] at [redacted]w[redacted]d. Supervision of other normal pregnancy, antepartum 2. [redacted] weeks gestation of pregnancy  3. Gestational diabetes mellitus (GDM) in third trimester, gestational diabetes method of control unspecified Reviewed blood glucose with patient. She reports pan-elevated fasting BG >100, but normal postprandial Recommended initiation of insulin vs metformin. Prefers to increase her physical activity to see if that improves fasting BG.  Will f/u with her via MyChart in 1 week to review BG. If persistently elevated, will start medications Reviewed kick  counts Discussed delivery by 39 weeks and serial growth US[place]week[place] BPPs at 32w if medications initiated Next growth scheduled 3/8  4. History of preterm delivery Reviewed si/sx PTL  5. ASCUS with positive high risk HPV cervical Next pap due 2026  6. Multigravida of advanced maternal age in third trimester ldASA   Return in about 2 weeks (around 01/08/2023) for return OB at 32 weeks.  Future Appointments  Date Time Provider DeHan[place]3/05/2023 12:30 PM [facility]/05/2023 12:45 PM [facility] US4 [facility]3/19/2024  9:10 AM Rasch, JeArtist PaisNP [facility]4/10/2022  9:30 AM LeGuss BundeMD [facility]4/15/2024  8:50 AM LeGuss BundeMD [facility]4/23/2024  9:30 AM SiRenee HarderCNM [facility]4/29/2024  8:50 AM FoInez CatalinaMD [facility]WL[facility] KyInez CatalinaMD

## 2022-12-29 ENCOUNTER — Other Ambulatory Visit: Payer: Self-pay | Admitting: *Deleted

## 2022-12-29 ENCOUNTER — Ambulatory Visit: Payer: Medicaid Other | Attending: Obstetrics

## 2022-12-29 ENCOUNTER — Ambulatory Visit: Payer: Medicaid Other | Admitting: *Deleted

## 2022-12-29 VITALS — BP 126/69 | HR 81

## 2022-12-29 DIAGNOSIS — Z8632 Personal history of gestational diabetes: Secondary | ICD-10-CM | POA: Insufficient documentation

## 2022-12-29 DIAGNOSIS — O24419 Gestational diabetes mellitus in pregnancy, unspecified control: Secondary | ICD-10-CM

## 2022-12-29 DIAGNOSIS — Z3A30 30 weeks gestation of pregnancy: Secondary | ICD-10-CM

## 2022-12-29 DIAGNOSIS — O09292 Supervision of pregnancy with other poor reproductive or obstetric history, second trimester: Secondary | ICD-10-CM

## 2022-12-29 DIAGNOSIS — O09213 Supervision of pregnancy with history of pre-term labor, third trimester: Secondary | ICD-10-CM

## 2022-12-29 DIAGNOSIS — O24410 Gestational diabetes mellitus in pregnancy, diet controlled: Secondary | ICD-10-CM

## 2022-12-29 DIAGNOSIS — O43193 Other malformation of placenta, third trimester: Secondary | ICD-10-CM | POA: Diagnosis not present

## 2022-12-29 DIAGNOSIS — O09299 Supervision of pregnancy with other poor reproductive or obstetric history, unspecified trimester: Secondary | ICD-10-CM

## 2022-12-29 DIAGNOSIS — O09899 Supervision of other high risk pregnancies, unspecified trimester: Secondary | ICD-10-CM

## 2023-01-01 ENCOUNTER — Encounter: Payer: Self-pay | Admitting: Obstetrics and Gynecology

## 2023-01-01 DIAGNOSIS — O24414 Gestational diabetes mellitus in pregnancy, insulin controlled: Secondary | ICD-10-CM

## 2023-01-03 ENCOUNTER — Encounter: Payer: Self-pay | Admitting: Obstetrics & Gynecology

## 2023-01-04 MED ORDER — INSULIN PEN NEEDLE 32G X 4 MM MISC: 5.0000 | Freq: Every day | 3 refills | Status: DC

## 2023-01-04 MED ORDER — INSULIN GLARGINE 100 UNIT/ML SOLOSTAR PEN: 12.0000 [IU] | PEN_INJECTOR | Freq: Every day | SUBCUTANEOUS | 1 refills | Status: DC

## 2023-01-04 NOTE — Addendum Note (Signed)
Addended by: Gale Journey on: 01/04/2023 01:30 PM   Modules accepted: Orders

## 2023-01-05 NOTE — Telephone Encounter (Signed)
Can we make sure pt meets with diabetes education and starts weekly BPPs next week (at 32w)? Thank you! - KF

## 2023-01-09 ENCOUNTER — Encounter: Payer: Medicaid Other | Admitting: Advanced Practice Midwife

## 2023-01-09 ENCOUNTER — Ambulatory Visit: Payer: Medicaid Other

## 2023-01-09 ENCOUNTER — Other Ambulatory Visit: Payer: Medicaid Other

## 2023-01-09 ENCOUNTER — Other Ambulatory Visit: Payer: Self-pay | Admitting: Obstetrics

## 2023-01-09 DIAGNOSIS — O24419 Gestational diabetes mellitus in pregnancy, unspecified control: Secondary | ICD-10-CM

## 2023-01-16 ENCOUNTER — Telehealth: Payer: Self-pay | Admitting: *Deleted

## 2023-01-16 NOTE — Telephone Encounter (Signed)
Left patient an urgent message to call and schedule NSTs.

## 2023-01-22 ENCOUNTER — Ambulatory Visit (INDEPENDENT_AMBULATORY_CARE_PROVIDER_SITE_OTHER): Payer: Medicaid Other | Admitting: Obstetrics & Gynecology

## 2023-01-22 VITALS — BP 128/80 | HR 85 | Wt 168.0 lb

## 2023-01-22 DIAGNOSIS — O24419 Gestational diabetes mellitus in pregnancy, unspecified control: Secondary | ICD-10-CM

## 2023-01-22 DIAGNOSIS — O24410 Gestational diabetes mellitus in pregnancy, diet controlled: Secondary | ICD-10-CM

## 2023-01-22 DIAGNOSIS — Z3480 Encounter for supervision of other normal pregnancy, unspecified trimester: Secondary | ICD-10-CM

## 2023-01-22 DIAGNOSIS — Z3A34 34 weeks gestation of pregnancy: Secondary | ICD-10-CM | POA: Diagnosis not present

## 2023-01-22 NOTE — Progress Notes (Signed)
   PRENATAL VISIT NOTE  Subjective:  Sabrina Rogers is a 37 y.o. [phone] at [redacted]w[redacted]d being seen today for ongoing prenatal care.  She is currently monitored for the following issues for this high-risk pregnancy and has ASCUS with positive high risk HPV cervical; History of preterm delivery; Supervision of other normal pregnancy, antepartum; GDM (gestational diabetes mellitus); and Multigravida of advanced maternal age in third trimester on their problem list.  Patient reports no complaints.  Contractions: Not present. Vag. Bleeding: None.  Movement: Present. Denies leaking of fluid.   The following portions of the patient's history were reviewed and updated as appropriate: allergies, current medications, past family history, past medical history, past social history, past surgical history and problem list.   Objective:   Vitals:   01/22/23 0938  BP: 128/80  Pulse: 85  Weight: 76.2 kg    Fetal Status:     Movement: Present     General:  Alert, oriented and cooperative. Patient is in no acute distress.  Skin: Skin is warm and dry. No rash noted.   Cardiovascular: Normal heart rate noted  Respiratory: Normal respiratory effort, no problems with respiration noted  Abdomen: Soft, gravid, appropriate for gestational age.  Pain/Pressure: Absent     Pelvic: Cervical exam deferred        Extremities: Normal range of motion.  Edema: None  Mental Status: Normal mood and affect. Normal behavior. Normal judgment and thought content.   Assessment and Plan:  Pregnancy: [id number] at [redacted]w[redacted]d 1. Gestational diabetes mellitus (GDM) in third trimester, gestational diabetes method of control unspecified Fastings elevated to 100 the past few days.  Pp are wnl.  Baseline:  145 Accelerations: present Decelerations: absent Variability: moderate Interpretation:  Reactive NST   2. Supervision of other normal pregnancy, antepartum  Induction 39 weeks latest.  Preterm labor symptoms and general obstetric  precautions including but not limited to vaginal bleeding, contractions, leaking of fluid and fetal movement were reviewed in detail with the patient. Please refer to After Visit Summary for other counseling recommendations.   3.  Contipattion Colace and Miraliax and "squatty Pottie"  No follow-ups on file.  Future Appointments  Date Time Provider [place]  01/29/2023 12:30 PM [facility] NURSE [facility]  01/29/2023 12:45 PM [facility] US5 [facility]  02/05/2023  8:50 AM Guss Bunde, MD [facility]  02/13/2023  9:30 AM Renee Harder, CNM [facility]  02/19/2023  8:50 AM Inez Catalina, MD [facility]    Silas Sacramento, MD

## 2023-01-23 ENCOUNTER — Ambulatory Visit: Payer: Medicaid Other | Admitting: Obstetrics & Gynecology

## 2023-01-25 ENCOUNTER — Ambulatory Visit (INDEPENDENT_AMBULATORY_CARE_PROVIDER_SITE_OTHER): Payer: Medicaid Other | Admitting: *Deleted

## 2023-01-25 ENCOUNTER — Encounter: Payer: Self-pay | Admitting: *Deleted

## 2023-01-25 ENCOUNTER — Ambulatory Visit (INDEPENDENT_AMBULATORY_CARE_PROVIDER_SITE_OTHER): Payer: Medicaid Other

## 2023-01-25 VITALS — BP 132/81 | HR 78

## 2023-01-25 DIAGNOSIS — O09523 Supervision of elderly multigravida, third trimester: Secondary | ICD-10-CM | POA: Diagnosis not present

## 2023-01-25 DIAGNOSIS — O24414 Gestational diabetes mellitus in pregnancy, insulin controlled: Secondary | ICD-10-CM

## 2023-01-25 DIAGNOSIS — Z3A34 34 weeks gestation of pregnancy: Secondary | ICD-10-CM

## 2023-01-25 DIAGNOSIS — O24419 Gestational diabetes mellitus in pregnancy, unspecified control: Secondary | ICD-10-CM

## 2023-01-25 DIAGNOSIS — O24410 Gestational diabetes mellitus in pregnancy, diet controlled: Secondary | ICD-10-CM | POA: Diagnosis not present

## 2023-01-25 DIAGNOSIS — Z3480 Encounter for supervision of other normal pregnancy, unspecified trimester: Secondary | ICD-10-CM

## 2023-01-25 DIAGNOSIS — Z3A36 36 weeks gestation of pregnancy: Secondary | ICD-10-CM

## 2023-01-25 NOTE — Progress Notes (Signed)
NST-Reactive and AFI is 20.55cm.  Sent to Dr Nelda Marseille for review

## 2023-01-29 ENCOUNTER — Ambulatory Visit: Payer: Medicaid Other | Attending: Obstetrics

## 2023-01-29 ENCOUNTER — Ambulatory Visit: Payer: Medicaid Other | Admitting: *Deleted

## 2023-01-29 ENCOUNTER — Encounter: Payer: Self-pay | Admitting: *Deleted

## 2023-01-29 VITALS — BP 130/83 | HR 85

## 2023-01-29 DIAGNOSIS — O09213 Supervision of pregnancy with history of pre-term labor, third trimester: Secondary | ICD-10-CM | POA: Diagnosis not present

## 2023-01-29 DIAGNOSIS — O09293 Supervision of pregnancy with other poor reproductive or obstetric history, third trimester: Secondary | ICD-10-CM | POA: Insufficient documentation

## 2023-01-29 DIAGNOSIS — Z3480 Encounter for supervision of other normal pregnancy, unspecified trimester: Secondary | ICD-10-CM | POA: Insufficient documentation

## 2023-01-29 DIAGNOSIS — O24419 Gestational diabetes mellitus in pregnancy, unspecified control: Secondary | ICD-10-CM | POA: Insufficient documentation

## 2023-01-29 DIAGNOSIS — O43193 Other malformation of placenta, third trimester: Secondary | ICD-10-CM

## 2023-01-29 DIAGNOSIS — O24414 Gestational diabetes mellitus in pregnancy, insulin controlled: Secondary | ICD-10-CM

## 2023-01-29 DIAGNOSIS — Z3A35 35 weeks gestation of pregnancy: Secondary | ICD-10-CM | POA: Insufficient documentation

## 2023-01-29 DIAGNOSIS — O09899 Supervision of other high risk pregnancies, unspecified trimester: Secondary | ICD-10-CM

## 2023-02-02 ENCOUNTER — Other Ambulatory Visit: Payer: Self-pay | Admitting: Obstetrics & Gynecology

## 2023-02-05 ENCOUNTER — Other Ambulatory Visit (HOSPITAL_COMMUNITY)
Admission: RE | Admit: 2023-02-05 | Discharge: 2023-02-05 | Disposition: A | Discharge status: Home / Self Care | Payer: Medicaid Other | Source: Ambulatory Visit | Attending: Obstetrics & Gynecology | Admitting: Obstetrics & Gynecology

## 2023-02-05 ENCOUNTER — Other Ambulatory Visit: Payer: Self-pay | Admitting: Obstetrics

## 2023-02-05 ENCOUNTER — Encounter: Payer: Self-pay | Admitting: Obstetrics

## 2023-02-05 ENCOUNTER — Ambulatory Visit (INDEPENDENT_AMBULATORY_CARE_PROVIDER_SITE_OTHER): Payer: Medicaid Other | Admitting: Obstetrics & Gynecology

## 2023-02-05 ENCOUNTER — Ambulatory Visit (INDEPENDENT_AMBULATORY_CARE_PROVIDER_SITE_OTHER): Payer: Medicaid Other

## 2023-02-05 ENCOUNTER — Encounter: Payer: Medicaid Other | Admitting: Obstetrics & Gynecology

## 2023-02-05 VITALS — BP 136/84 | HR 72 | Wt 168.0 lb

## 2023-02-05 DIAGNOSIS — Z3480 Encounter for supervision of other normal pregnancy, unspecified trimester: Secondary | ICD-10-CM | POA: Diagnosis not present

## 2023-02-05 DIAGNOSIS — O09899 Supervision of other high risk pregnancies, unspecified trimester: Secondary | ICD-10-CM

## 2023-02-05 DIAGNOSIS — O24414 Gestational diabetes mellitus in pregnancy, insulin controlled: Secondary | ICD-10-CM | POA: Diagnosis not present

## 2023-02-05 DIAGNOSIS — Z3A36 36 weeks gestation of pregnancy: Secondary | ICD-10-CM | POA: Diagnosis not present

## 2023-02-05 DIAGNOSIS — O24419 Gestational diabetes mellitus in pregnancy, unspecified control: Secondary | ICD-10-CM

## 2023-02-05 MED ORDER — INSULIN GLARGINE 100 UNIT/ML SOLOSTAR PEN: 12.0000 [IU] | PEN_INJECTOR | Freq: Every day | SUBCUTANEOUS | 1 refills | Status: DC

## 2023-02-05 MED ORDER — ACCU-CHEK GUIDE VI STRP: ORAL_STRIP | 1 refills | Status: DC

## 2023-02-05 NOTE — Progress Notes (Signed)
Pt has not used Lantus since Thursday night- states sugars have been normal

## 2023-02-05 NOTE — Progress Notes (Signed)
NST reactive and reviewed by Dr.Leggett 

## 2023-02-05 NOTE — Progress Notes (Signed)
   PRENATAL VISIT NOTE  Subjective:  Sabrina Rogers is a 37 y.o. [phone] at [redacted]w[redacted]d being seen today for ongoing prenatal care.  She is currently monitored for the following issues for this high-risk pregnancy and has ASCUS with positive high risk HPV cervical; History of preterm delivery; Supervision of other normal pregnancy, antepartum; GDM (gestational diabetes mellitus); and Multigravida of advanced maternal age in third trimester on their problem list.  Patient reports  sweating at night .  Contractions: Not present. Vag. Bleeding: None.  Movement: Present. Denies leaking of fluid.   The following portions of the patient's history were reviewed and updated as appropriate: allergies, current medications, past family history, past medical history, past social history, past surgical history and problem list.   Objective:   Vitals:   02/05/23 1001  BP: 136/84  Pulse: 72  Weight: 76.2 kg    Fetal Status:     Movement: Present     General:  Alert, oriented and cooperative. Patient is in no acute distress.  Skin: Skin is warm and dry. No rash noted.   Cardiovascular: Normal heart rate noted  Respiratory: Normal respiratory effort, no problems with respiration noted  Abdomen: Soft, gravid, appropriate for gestational age.  Pain/Pressure: Absent     Pelvic: Cervical exam deferred        Extremities: Normal range of motion.  Edema: None  Mental Status: Normal mood and affect. Normal behavior. Normal judgment and thought content.   Assessment and Plan:  Pregnancy: [id number] at [redacted]w[redacted]d 1. Supervision of other normal pregnancy, antepartum GBS and cultures today.  Vtx on [place].  2. Insulin controlled gestational diabetes mellitus (GDM) in third trimester Fastings are 24 the past 4 mornings.  (She is off insulin)  Insulin was out and she is picking up today.  She will pick insulin up and monitor CBGs to see if she needs to restart.   EFW on April 8th 2,725 g (6 lb) and at [employer].   Term  labor symptoms and general obstetric precautions including but not limited to vaginal bleeding, contractions, leaking of fluid and fetal movement were reviewed in detail with the patient. Please refer to After Visit Summary for other counseling recommendations.   No follow-ups on file.  Future Appointments  Date Time Provider Department Center  02/05/2023 10:10 AM [name], MD [facility]  02/13/2023  9:30 AM Brand Males, CNM [facility]  02/19/2023  8:50 AM Lennart Pall, MD [facility]    Elsie Lincoln, MD

## 2023-02-06 LAB — CERVICOVAGINAL ANCILLARY ONLY
Chlamydia: NEGATIVE
Comment: NEGATIVE
Comment: NORMAL
Neisseria Gonorrhea: NEGATIVE

## 2023-02-08 ENCOUNTER — Ambulatory Visit (INDEPENDENT_AMBULATORY_CARE_PROVIDER_SITE_OTHER): Payer: Medicaid Other | Admitting: *Deleted

## 2023-02-08 VITALS — BP 134/83 | Wt 167.0 lb

## 2023-02-08 DIAGNOSIS — Z3A36 36 weeks gestation of pregnancy: Secondary | ICD-10-CM | POA: Diagnosis not present

## 2023-02-08 DIAGNOSIS — O24414 Gestational diabetes mellitus in pregnancy, insulin controlled: Secondary | ICD-10-CM

## 2023-02-08 DIAGNOSIS — O24424 Gestational diabetes mellitus in childbirth, insulin controlled: Secondary | ICD-10-CM | POA: Diagnosis not present

## 2023-02-08 NOTE — Progress Notes (Signed)
Pt here for NST only which is reactive

## 2023-02-09 LAB — CULTURE, BETA STREP (GROUP B ONLY): Strep Gp B Culture: NEGATIVE

## 2023-02-11 ENCOUNTER — Encounter: Payer: Self-pay | Admitting: Obstetrics & Gynecology

## 2023-02-13 ENCOUNTER — Ambulatory Visit (INDEPENDENT_AMBULATORY_CARE_PROVIDER_SITE_OTHER): Payer: Medicaid Other

## 2023-02-13 ENCOUNTER — Encounter (HOSPITAL_COMMUNITY): Payer: Self-pay | Admitting: Obstetrics & Gynecology

## 2023-02-13 ENCOUNTER — Encounter: Payer: Self-pay | Admitting: Obstetrics & Gynecology

## 2023-02-13 ENCOUNTER — Ambulatory Visit (INDEPENDENT_AMBULATORY_CARE_PROVIDER_SITE_OTHER): Payer: Medicaid Other | Admitting: *Deleted

## 2023-02-13 ENCOUNTER — Inpatient Hospital Stay (HOSPITAL_COMMUNITY)
Admission: AD | Admit: 2023-02-13 | Discharge: 2023-02-16 | DRG: 807 | Disposition: A | Discharge status: Home / Self Care | Payer: Medicaid Other | Attending: Obstetrics and Gynecology | Admitting: Obstetrics and Gynecology

## 2023-02-13 VITALS — BP 143/95

## 2023-02-13 VITALS — BP 142/92 | HR 83

## 2023-02-13 DIAGNOSIS — Z3A37 37 weeks gestation of pregnancy: Secondary | ICD-10-CM

## 2023-02-13 DIAGNOSIS — O134 Gestational [pregnancy-induced] hypertension without significant proteinuria, complicating childbirth: Secondary | ICD-10-CM | POA: Diagnosis present

## 2023-02-13 DIAGNOSIS — O24414 Gestational diabetes mellitus in pregnancy, insulin controlled: Secondary | ICD-10-CM

## 2023-02-13 DIAGNOSIS — O09523 Supervision of elderly multigravida, third trimester: Secondary | ICD-10-CM | POA: Diagnosis present

## 2023-02-13 DIAGNOSIS — Z3480 Encounter for supervision of other normal pregnancy, unspecified trimester: Principal | ICD-10-CM

## 2023-02-13 DIAGNOSIS — O139 Gestational [pregnancy-induced] hypertension without significant proteinuria, unspecified trimester: Secondary | ICD-10-CM | POA: Diagnosis present

## 2023-02-13 DIAGNOSIS — O0990 Supervision of high risk pregnancy, unspecified, unspecified trimester: Secondary | ICD-10-CM

## 2023-02-13 DIAGNOSIS — O24424 Gestational diabetes mellitus in childbirth, insulin controlled: Secondary | ICD-10-CM | POA: Diagnosis present

## 2023-02-13 DIAGNOSIS — Z7982 Long term (current) use of aspirin: Secondary | ICD-10-CM | POA: Diagnosis not present

## 2023-02-13 DIAGNOSIS — O24419 Gestational diabetes mellitus in pregnancy, unspecified control: Secondary | ICD-10-CM | POA: Diagnosis present

## 2023-02-13 DIAGNOSIS — R030 Elevated blood-pressure reading, without diagnosis of hypertension: Secondary | ICD-10-CM

## 2023-02-13 LAB — COMPREHENSIVE METABOLIC PANEL
ALT: 18 U/L (ref 0–44)
AST: 22 U/L (ref 15–41)
Albumin: 2.8 g/dL — ABNORMAL LOW (ref 3.5–5.0)
Alkaline Phosphatase: 134 U/L — ABNORMAL HIGH (ref 38–126)
Anion gap: 10 (ref 5–15)
BUN: 13 mg/dL (ref 6–20)
CO2: 19 mmol/L — ABNORMAL LOW (ref 22–32)
Calcium: 9.5 mg/dL (ref 8.9–10.3)
Chloride: 105 mmol/L (ref 98–111)
Creatinine, Ser: 0.63 mg/dL (ref 0.44–1.00)
GFR, Estimated: >60 mL/min (ref >60)
Glucose, Bld: 101 mg/dL — ABNORMAL HIGH (ref 70–99)
Potassium: 3.9 mmol/L (ref 3.5–5.1)
Sodium: 134 mmol/L — ABNORMAL LOW (ref 135–145)
Total Bilirubin: 0.5 mg/dL (ref 0.3–1.2)
Total Protein: 6.3 g/dL — ABNORMAL LOW (ref 6.5–8.1)

## 2023-02-13 LAB — CBC
HCT: 35.2 % — ABNORMAL LOW (ref 36.0–46.0)
Hemoglobin: 12.3 g/dL (ref 12.0–15.0)
MCH: 31 pg (ref 26.0–34.0)
MCHC: 34.9 g/dL (ref 30.0–36.0)
MCV: 88.7 fL (ref 80.0–100.0)
Platelets: 166 10*3/uL (ref 150–400)
RBC: 3.97 MIL/uL (ref 3.87–5.11)
RDW: 13.2 % (ref 11.5–15.5)
WBC: 5.6 10*3/uL (ref 4.0–10.5)
nRBC: 0 % (ref 0.0–0.2)

## 2023-02-13 LAB — GLUCOSE, CAPILLARY
Glucose-Capillary: 106 mg/dL — ABNORMAL HIGH (ref 70–99)
Glucose-Capillary: 100 mg/dL — ABNORMAL HIGH (ref 70–99)

## 2023-02-13 LAB — PROTEIN / CREATININE RATIO, URINE
Creatinine, Urine: 104 mg/dL
Protein Creatinine Ratio: 0.1 mg/mg{Cre} (ref 0.00–0.15)
Total Protein, Urine: 10 mg/dL

## 2023-02-13 MED ORDER — MISOPROSTOL 25 MCG QUARTER TABLET
25.0000 ug | ORAL_TABLET | Freq: Once | ORAL | Status: AC
  Administered 2023-02-13: 25 ug via VAGINAL
  Filled 2023-02-13: qty 1

## 2023-02-13 MED ORDER — MISOPROSTOL 25 MCG QUARTER TABLET
25.0000 ug | ORAL_TABLET | Freq: Once | ORAL | Status: DC
  Filled 2023-02-13: qty 1

## 2023-02-13 MED ORDER — OXYTOCIN-SODIUM CHLORIDE 30-0.9 UT/500ML-% IV SOLN: 2.5000 [IU]/h | INTRAVENOUS | Status: DC

## 2023-02-13 MED ORDER — ACETAMINOPHEN 325 MG PO TABS: 650.0000 mg | ORAL_TABLET | ORAL | Status: DC | PRN

## 2023-02-13 MED ORDER — MISOPROSTOL 50MCG HALF TABLET
50.0000 ug | ORAL_TABLET | Freq: Once | ORAL | Status: AC
  Administered 2023-02-13: 50 ug via ORAL
  Filled 2023-02-13: qty 1

## 2023-02-13 MED ORDER — OXYTOCIN BOLUS FROM INFUSION
333.0000 mL | Freq: Once | INTRAVENOUS | Status: AC
  Administered 2023-02-14: 333 mL via INTRAVENOUS

## 2023-02-13 MED ORDER — ONDANSETRON HCL 4 MG/2ML IJ SOLN
4.0000 mg | Freq: Four times a day (QID) | INTRAMUSCULAR | Status: DC | PRN
  Administered 2023-02-14: 4 mg via INTRAVENOUS
  Filled 2023-02-13: qty 2

## 2023-02-13 MED ORDER — LACTATED RINGERS IV SOLN: 500.0000 mL | INTRAVENOUS | Status: DC | PRN

## 2023-02-13 MED ORDER — SOD CITRATE-CITRIC ACID 500-334 MG/5ML PO SOLN: 30.0000 mL | ORAL | Status: DC | PRN

## 2023-02-13 MED ORDER — TERBUTALINE SULFATE 1 MG/ML IJ SOLN
0.2500 mg | Freq: Once | INTRAMUSCULAR | Status: DC | PRN
  Filled 2023-02-13: qty 1

## 2023-02-13 MED ORDER — LIDOCAINE HCL (PF) 1 % IJ SOLN: 30.0000 mL | INTRAMUSCULAR | Status: DC | PRN

## 2023-02-13 MED ORDER — LACTATED RINGERS IV SOLN: INTRAVENOUS | Status: DC

## 2023-02-13 MED ORDER — MISOPROSTOL 50MCG HALF TABLET
50.0000 ug | ORAL_TABLET | Freq: Once | ORAL | Status: DC
  Filled 2023-02-13: qty 1

## 2023-02-13 NOTE — Progress Notes (Signed)
   PRENATAL VISIT NOTE  Subjective:  Sabrina Rogers is a 37 y.o. [phone] at [redacted]w[redacted]d being seen today for ongoing prenatal care.  She is currently monitored for the following issues for this high-risk pregnancy and has ASCUS with positive high risk HPV cervical; History of preterm delivery; Supervision of other normal pregnancy, antepartum; GDM (gestational diabetes mellitus); Multigravida of advanced maternal age in third trimester; and Gestational hypertension on their problem list.  Patient reports no complaints.   .  .   . Denies leaking of fluid.   The following portions of the patient's history were reviewed and updated as appropriate: allergies, current medications, past family history, past medical history, past social history, past surgical history and problem list.   Objective:   Vitals:   02/13/23 0948  BP: (!) 143/95    Fetal Status:           General:  Alert, oriented and cooperative. Patient is in no acute distress.  Skin: Skin is warm and dry. No rash noted.   Cardiovascular: Normal heart rate noted  Respiratory: Normal respiratory effort, no problems with respiration noted  Abdomen: Soft, gravid, appropriate for gestational age.        Pelvic: Cervical exam deferred        Extremities: Normal range of motion.     Mental Status: Normal mood and affect. Normal behavior. Normal judgment and thought content.   Assessment and Plan:  Pregnancy: [id number] at [redacted]w[redacted]d 1. Supervision of high risk pregnancy, antepartum - Routine OB. Doing well, no concerns - NST reactive. Baseline 140bpm, moderate variability, +15x15 accels, no decels  2. [redacted] weeks gestation of pregnancy - Endorses active fetal movement  3. Insulin controlled gestational diabetes mellitus (GDM) in third trimester - Did not bring log but reports fasting CBG's have been 85-93. She reports one single elevated CBG of 97. She has not been taking her insulin  4. Elevated BP without diagnosis of hypertension - New  onset elevated BP's today. BP's consistently 140s/90s. Patient is asymptomatic - Sent to MAU for further evaluation. RTO in 1 week and NST if work up normal.    Term labor symptoms and general obstetric precautions including but not limited to vaginal bleeding, contractions, leaking of fluid and fetal movement were reviewed in detail with the patient. Please refer to After Visit Summary for other counseling recommendations.   Return in about 1 week (around 02/20/2023).  Future Appointments  Date Time Provider Department Center  02/16/2023  9:00 AM [facility] NURSE [facility]  02/19/2023  8:50 AM Lennart Pall, MD [facility]  02/19/2023  9:30 AM [facility]URSE [facility]  02/22/2023  9:00 AM [facility] NURSE [facility]    Brand Males, CNM

## 2023-02-13 NOTE — Progress Notes (Signed)
NST and AFI today Reactive and AFI WNL.  Due to several elevated BP's D Simpson,CNM has sent pt to MAU for evaluation.

## 2023-02-13 NOTE — H&P (Signed)
OBSTETRIC ADMISSION HISTORY AND PHYSICAL  Cornell Gaber is a 37 y.o. female [phone] with IUP at [redacted]w[redacted]d by LMP presenting for IOL due to HTN. She reports +FMs, No LOF, no VB, no blurry vision, headaches or peripheral edema, and RUQ pain.  She plans on breast feeding. She request IUD (Mirena) for birth control, to be placed at her postpartum appointment. She received her prenatal care at  [facility]    Dating: By LMP --->  Estimated Date of Delivery: 03/05/23  Sono:    @[redacted]w[redacted]d , CWD, normal anatomy, variable presentation, anterior placental lie, 2725 g, 65% EFW   Prenatal History/Complications:  -A2GDM -GHTN -2 vessel umbilical cord -History of preterm delivery (34 weeks)         Nursing Staff Provider  Office Location [place] Dating  03/05/2023, by Last Menstrual Period  [facility] Model [x ] Traditional [ ]  Centering [ ]  Mom-Baby Dyad Anatomy US  Single umbilical artery  Language  English      Flu Vaccine    Genetic/Carrier Screen  NIPS:  low risk female  AFP: Negative   Horizon:  TDaP Vaccine   Done 12/11/22 Hgb A1C or  GTT Early 90/161/141 Third trimester +GDM  COVID Vaccine     LAB RESULTS   Rhogam  A/RH(D) POSITIVE/-- (10/23 0919)  Blood Type A/RH(D) POSITIVE/-- (10/23 0919)   Baby Feeding Plan Breast Antibody NO ANTIBODIES DETECTED (10/23 0919)  Contraception Mirena UD Rubella 8.56 (10/23 0919)  Circumcision NA RPR NON-REACTIVE (10/23 0919)   Pediatrician  Undecided HBsAg NON-REACTIVE (10/23 0919)   Support Person Alinda Money HCVAb   Negative  Prenatal Classes declined HIV NON-REACTIVE (10/23 0919)     BTL Consent N/a GBS (For PCN allergy, check sensitivities)   VBAC Consent N/a Pap       Diagnosis  Date Value Ref Range Status  11/14/2021     Final    - Negative for intraepithelial lesion or malignancy (NILM)             DME Rx [ ]  BP cuff [ ]  Weight Scale Waterbirth  N/a  PHQ9 & GAD7 [x ] new OB [  ] 28 weeks  [  ] 36 weeks Induction  [ ]  Orders Entered [ ] Foley Y/N     Past  Medical History: Past Medical History:  Diagnosis Date   Gestational diabetes    Vaginal Pap smear, abnormal     Past Surgical History: Past Surgical History:  Procedure Laterality Date   NO PAST SURGERIES      Obstetrical History: OB History     Gravida  3   Para  1   Term      Preterm  1   AB  1   Living  1      SAB  1   IAB      Ectopic      Multiple  0   Live Births  1           Social History Social History   Socioeconomic History   Marital status: Single    Spouse name: Not on file   Number of children: Not on file   Years of education: Not on file   Highest education level: Not on file  Occupational History   Not on file  Tobacco Use   Smoking status: Never   Smokeless tobacco: Never  Vaping Use   Vaping Use: Never used  Substance and Sexual Activity   Alcohol use: Never   Drug  use: Never   Sexual activity: Yes    Birth control/protection: None  Other Topics Concern   Not on file  Social History Narrative   Not on file   Social Determinants of Health   Financial Resource Strain: Not on file  Food Insecurity: Not on file  Transportation Needs: Not on file  Physical Activity: Not on file  Stress: Not on file  Social Connections: Not on file    Family History: Family History  Problem Relation Age of Onset   Hypertension Mother    Hypothyroidism Mother    Hypertension Father     Allergies: No Known Allergies  Medications Prior to Admission  Medication Sig Dispense Refill Last Dose   aspirin EC 81 MG tablet Take 1 tablet (81 mg total) by mouth daily. Swallow whole. 30 tablet 12 02/13/2023   Prenatal Vit-Fe Fumarate-FA (MULTIVITAMIN-PRENATAL) 27-0.8 MG TABS tablet Take 1 tablet by mouth daily at 12 noon.   02/13/2023   ACCU-CHEK GUIDE test strip USE UP TO FOUR TIMES DAILY AS DIRECTED 100 each 1    Accu-Chek Softclix Lancets lancets SMARTSIG:Topical 1-4 Times Daily      blood glucose meter kit and supplies KIT Dispense  based on patient and insurance preference. Use up to four times daily as directed. 1 each 0    insulin glargine (LANTUS) 100 UNIT/ML Solostar Pen Inject 12 Units into the skin at bedtime. 15 mL 1    Insulin Pen Needle 32G X 4 MM MISC 5 Needles by Does not apply route daily. Use to inject insulin subcutaneously as directed by your physician 100 each 3      Review of Systems   All systems reviewed and negative except as stated in HPI  Blood pressure 123/85, pulse 87, temperature 98.6 F (37 C), temperature source Oral, resp. rate 18, height  (1.626 m), weight 75.8 kg, last menstrual period 05/29/2022, SpO2 99 %. General appearance: alert, cooperative, appears stated age, and no distress Lungs: clear to auscultation bilaterally Heart: regular rate and rhythm Abdomen: soft, non-tender; bowel sounds normal Extremities: Homans sign is negative, no sign of DVT Presentation: Cephalic per exam  Fetal monitoring: Baseline 135, mod var, + accels, no decels Uterine activity: rare Dilation: Fingertip Effacement (%): Thick Station: Ballotable Exam by:: Dr. Alvester Morin   Prenatal labs: ABO, Rh: A/RH(D) POSITIVE/-- (10/23 0919) Antibody: NO ANTIBODIES DETECTED (10/23 0919) Rubella: 8.56 (10/23 0919) RPR: Non Reactive (02/19 0900)  HBsAg: NON-REACTIVE (10/23 0919)  HIV: Non Reactive (02/19 0900)  GBS: Negative/-- (04/15 1042)  2 hr Glucola" Abnormal  Genetic screening  Low risk Anatomy US WNL  Prenatal Transfer Tool  Maternal Diabetes: Yes:  Diabetes Type:  Insulin/Medication controlled Genetic Screening: Normal Maternal Ultrasounds/Referrals: Normal Fetal Ultrasounds or other Referrals:  Other:  2 vessel cord  Maternal Substance Abuse:  No Significant Maternal Medications:  None Significant Maternal Lab Results:  Group B Strep negative Number of Prenatal Visits:greater than 3 verified prenatal visits Other Comments:  None  Results for orders placed or performed during the hospital  encounter of 02/13/23 (from the past 24 hour(s))  CBC   Collection Time: 02/13/23 11:37 AM  Result Value Ref Range   WBC 5.6 4.0 - 10.5 K/uL   RBC 3.97 3.87 - 5.11 MIL/uL   Hemoglobin 12.3 12.0 - 15.0 g/dL   HCT 40.9 (L) 81.1 - 91.4 %   MCV 88.7 80.0 - 100.0 fL   MCH 31.0 26.0 - 34.0 pg   MCHC 34.9 30.0 -  36.0 g/dL   RDW 16.1 09.6 - 04.5 %   Platelets 166 150 - 400 K/uL   nRBC 0.0 0.0 - 0.2 %  Comprehensive metabolic panel   Collection Time: 02/13/23 11:37 AM  Result Value Ref Range   Sodium 134 (L) 135 - 145 mmol/L   Potassium 3.9 3.5 - 5.1 mmol/L   Chloride 105 98 - 111 mmol/L   CO2 19 (L) 22 - 32 mmol/L   Glucose, Bld 101 (H) 70 - 99 mg/dL   BUN 13 6 - 20 mg/dL   Creatinine, Ser 4.09 0.44 - 1.00 mg/dL   Calcium 9.5 8.9 - 81.1 mg/dL   Total Protein 6.3 (L) 6.5 - 8.1 g/dL   Albumin 2.8 (L) 3.5 - 5.0 g/dL   AST 22 15 - 41 U/L   ALT 18 0 - 44 U/L   Alkaline Phosphatase 134 (H) 38 - 126 U/L   Total Bilirubin 0.5 0.3 - 1.2 mg/dL   GFR, Estimated >91 >47 mL/min   Anion gap 10 5 - 15  Protein / creatinine ratio, urine   Collection Time: 02/13/23 11:50 AM  Result Value Ref Range   Creatinine, Urine 104 mg/dL   Total Protein, Urine 10 mg/dL   Protein Creatinine Ratio 0.10 0.00 - 0.15 mg/mg[Cre]    Patient Active Problem List   Diagnosis Date Noted   Gestational hypertension 02/13/2023   Multigravida of advanced maternal age in third trimester 12/25/2022   Supervision of other normal pregnancy, antepartum 08/07/2022   GDM (gestational diabetes mellitus) 08/07/2022   History of preterm delivery 07/01/2019   ASCUS with positive high risk HPV cervical 05/27/2018    Assessment/Plan:  Jillene Wehrenberg is a 37 y.o. [id number] at [redacted]w[redacted]d here for IOL due to HTN.   #Labor: Plan for dual Cytotec once bed space is available on L&D  #GHTN: Mild elevations, PEC labs WNL, asymptomatic on admission  #A2GDM --Has not been consistently taking her insulin at home due to fasting sugars  being at goal. Home regimen: Lantus 12U qhs  --Continue to monitor BG closely and use insulin as appropriate. BG q4h during latent labor and q2h during active labor  #Pain: Epidural in active labor  #FWB:  --Cat I tracing. EFW 65%/ 2725g on 01/29/2023 at 35+0 --2VC  #ID: GBS negative   #MOF: Breast  #MOC: Mirena IUD at postpartum visit  Clayton Bibles, MSA, MSN, CNM Certified Nurse Midwife, Chiropractor

## 2023-02-13 NOTE — Progress Notes (Addendum)
S Sabrina Rogers is a 37 y.o. [phone] female who presents to MAU today with complaint of elevated BP, meets GHTN criteria. Had elevated BP at 22 weeks,   ROS: Denies HA, blurry vision, RUQ pain.   O BP (!) 145/89   Pulse 89   Temp 98.6 F (37 C) (Oral)   Resp 18   Ht  (1.626 m)   Wt 75.8 kg   LMP 05/29/2022   SpO2 98%   BMI 28.67 kg/m   Patient Vitals for the past 24 hrs:  BP Temp Temp src Pulse Resp SpO2 Height Weight  02/13/23 1200 (!) 124/95 -- -- (!) 104 -- 99 % -- --  02/13/23 1145 (!) 145/89 -- -- 89 -- 98 % -- --  02/13/23 1138 (!) 134/96 -- -- 100 18 -- -- --  02/13/23 1137 -- 98.6 F (37 C) Oral -- -- --  (1.626 m) --  02/13/23 1127 -- -- -- -- -- -- -- 75.8 kg    Physical Exam Vitals and nursing note reviewed.  Constitutional:      General: She is not in acute distress.    Appearance: She is well-developed.     Comments: Pregnant female  HENT:     Head: Normocephalic and atraumatic.  Eyes:     General: No scleral icterus.    Conjunctiva/sclera: Conjunctivae normal.  Cardiovascular:     Rate and Rhythm: Normal rate.  Pulmonary:     Effort: Pulmonary effort is normal.  Chest:     Chest wall: No tenderness.  Abdominal:     Palpations: Abdomen is soft.     Tenderness: There is no abdominal tenderness. There is no guarding or rebound.     Comments: Gravid  Genitourinary:    Vagina: Normal.  Musculoskeletal:        General: Normal range of motion.     Cervical back: Normal range of motion and neck supple.  Skin:    General: Skin is warm and dry.     Findings: No rash.  Neurological:     Mental Status: She is alert and oriented to person, place, and time.    Dilation: Fingertip Effacement (%): Thick Station: Ballotable Exam by:: Dr. Alvester Morin  Bedside US showed fetus is cephalic.   Assessment GHTN   Plan: Admit to LD Will start cytotec here in MAU to help with cervical ripening. Vaginal and Oral ordered.  Team on LD is aware of  patient LD currently full and patient will be holding here   EFM: Reactive, Cat I  Federico Flake, MD 02/13/2023 12:04 PM

## 2023-02-13 NOTE — MAU Note (Signed)
.  Sabrina Rogers is a 37 y.o. at [redacted]w[redacted]d here in MAU reporting: sent over from the office for new onset HBP. Denies pain or any other complaints.  PIH Assessment: Headache present: No  Visual disturbances: None RUQ pain/Epigastric: None Atypical edema: None Hx of HBP: Patient denies BP Medications: N/A   Vaginal Bleeding: Vaginal Bleeding Vag. Bleeding: None  Abnormal discharge/LOF: Membranes Sac Identifier: Sac 1 Membrane Status: Intact Amount: None and Amount: None  Fetal Movement: Reports positive FM  LMP: Patient's last menstrual period was 05/29/2022. Pain score:    Vitals:   02/13/23 1137 02/13/23 1138  BP:  (!) 134/96  Pulse:  100  Resp:  18  Temp: 98.6 F (37 C)       FHT: Fetal Heart Rate Mode: External Baseline Rate (A): 135 bpm  OB Office: Faculty Lab orders placed from triage: Urinalysis

## 2023-02-14 ENCOUNTER — Inpatient Hospital Stay (HOSPITAL_BASED_OUTPATIENT_CLINIC_OR_DEPARTMENT_OTHER): Payer: Self-pay | Admitting: Anesthesiology

## 2023-02-14 ENCOUNTER — Encounter (HOSPITAL_BASED_OUTPATIENT_CLINIC_OR_DEPARTMENT_OTHER): Payer: Self-pay | Admitting: Anesthesiology

## 2023-02-14 ENCOUNTER — Other Ambulatory Visit: Payer: Self-pay

## 2023-02-14 DIAGNOSIS — Z3A37 37 weeks gestation of pregnancy: Secondary | ICD-10-CM

## 2023-02-14 DIAGNOSIS — O24424 Gestational diabetes mellitus in childbirth, insulin controlled: Secondary | ICD-10-CM

## 2023-02-14 DIAGNOSIS — O09523 Supervision of elderly multigravida, third trimester: Secondary | ICD-10-CM

## 2023-02-14 DIAGNOSIS — O134 Gestational [pregnancy-induced] hypertension without significant proteinuria, complicating childbirth: Secondary | ICD-10-CM

## 2023-02-14 LAB — TYPE AND SCREEN
ABO/RH(D): A POS
Antibody Screen: NEGATIVE

## 2023-02-14 LAB — GLUCOSE, CAPILLARY
Glucose-Capillary: 88 mg/dL (ref 70–99)
Glucose-Capillary: 80 mg/dL (ref 70–99)
Glucose-Capillary: 80 mg/dL (ref 70–99)
Glucose-Capillary: 88 mg/dL (ref 70–99)
Glucose-Capillary: 106 mg/dL — ABNORMAL HIGH (ref 70–99)
Glucose-Capillary: 99 mg/dL (ref 70–99)
Glucose-Capillary: 105 mg/dL — ABNORMAL HIGH (ref 70–99)

## 2023-02-14 LAB — HEMOGLOBIN A1C
Hgb A1c MFr Bld: 5.2 % (ref 4.8–5.6)
Mean Plasma Glucose: 102.54 mg/dL

## 2023-02-14 MED ORDER — FENTANYL CITRATE (PF) 100 MCG/2ML IJ SOLN
INTRAMUSCULAR | Status: AC
  Administered 2023-02-14: 100 ug via INTRAVENOUS
  Filled 2023-02-14: qty 2

## 2023-02-14 MED ORDER — LACTATED RINGERS IV SOLN
500.0000 mL | Freq: Once | INTRAVENOUS | Status: AC
  Administered 2023-02-14: 500 mL via INTRAVENOUS

## 2023-02-14 MED ORDER — INSULIN ASPART 100 UNIT/ML IJ SOLN: 0.0000 [IU] | INTRAMUSCULAR | Status: DC

## 2023-02-14 MED ORDER — EPHEDRINE 5 MG/ML INJ: 10.0000 mg | INTRAVENOUS | Status: DC | PRN

## 2023-02-14 MED ORDER — MISOPROSTOL 25 MCG QUARTER TABLET
25.0000 ug | ORAL_TABLET | ORAL | Status: DC
  Administered 2023-02-14: 25 ug via VAGINAL
  Filled 2023-02-14: qty 1

## 2023-02-14 MED ORDER — TERBUTALINE SULFATE 1 MG/ML IJ SOLN: 0.2500 mg | Freq: Once | INTRAMUSCULAR | Status: DC | PRN

## 2023-02-14 MED ORDER — PHENYLEPHRINE 80 MCG/ML (10ML) SYRINGE FOR IV PUSH (FOR BLOOD PRESSURE SUPPORT): 80.0000 ug | PREFILLED_SYRINGE | INTRAVENOUS | Status: DC | PRN

## 2023-02-14 MED ORDER — FAMOTIDINE IN NACL 20-0.9 MG/50ML-% IV SOLN
20.0000 mg | Freq: Once | INTRAVENOUS | Status: AC
  Administered 2023-02-14: 20 mg via INTRAVENOUS
  Filled 2023-02-14: qty 50

## 2023-02-14 MED ORDER — FENTANYL CITRATE (PF) 100 MCG/2ML IJ SOLN
50.0000 ug | INTRAMUSCULAR | Status: DC | PRN
  Administered 2023-02-14: 100 ug via INTRAVENOUS
  Filled 2023-02-14: qty 2

## 2023-02-14 MED ORDER — LACTATED RINGERS AMNIOINFUSION: INTRAVENOUS | Status: DC

## 2023-02-14 MED ORDER — CALCIUM CARBONATE ANTACID 500 MG PO CHEW: 1.0000 | CHEWABLE_TABLET | Freq: Three times a day (TID) | ORAL | Status: DC | PRN

## 2023-02-14 MED ORDER — FENTANYL-BUPIVACAINE-NACL 0.5-0.125-0.9 MG/250ML-% EP SOLN
12.0000 mL/h | EPIDURAL | Status: DC | PRN
  Administered 2023-02-14: 12 mL/h via EPIDURAL
  Filled 2023-02-14: qty 250

## 2023-02-14 MED ORDER — DIPHENHYDRAMINE HCL 50 MG/ML IJ SOLN: 12.5000 mg | INTRAMUSCULAR | Status: DC | PRN

## 2023-02-14 MED ORDER — OXYTOCIN-SODIUM CHLORIDE 30-0.9 UT/500ML-% IV SOLN
1.0000 m[IU]/min | INTRAVENOUS | Status: DC
  Administered 2023-02-14: 2 m[IU]/min via INTRAVENOUS
  Filled 2023-02-14 (×2): qty 500

## 2023-02-14 MED ORDER — BUPIVACAINE HCL (PF) 0.25 % IJ SOLN
INTRAMUSCULAR | Status: DC | PRN
  Administered 2023-02-14 (×2): 3 mL via EPIDURAL

## 2023-02-14 MED ORDER — LIDOCAINE-EPINEPHRINE (PF) 2 %-1:200000 IJ SOLN
INTRAMUSCULAR | Status: DC | PRN
  Administered 2023-02-14: 3 mL via EPIDURAL

## 2023-02-14 MED ORDER — INSULIN ASPART 100 UNIT/ML IJ SOLN: 0.0000 [IU] | Freq: Three times a day (TID) | INTRAMUSCULAR | Status: DC

## 2023-02-14 NOTE — Anesthesia Procedure Notes (Signed)
Epidural Patient location during procedure: OB Start time: 02/14/2023 12:38 PM End time: 02/14/2023 12:57 PM  Staffing Anesthesiologist: Val Eagle, MD Performed: anesthesiologist   Preanesthetic Checklist Completed: patient identified, IV checked, risks and benefits discussed, monitors and equipment checked, pre-op evaluation and timeout performed  Epidural Patient position: sitting Prep: DuraPrep Patient monitoring: heart rate, continuous pulse ox and blood pressure Approach: midline Location: L4-L5 Injection technique: LOR saline  Needle:  Needle type: Tuohy  Needle gauge: 17 G Needle length: 9 cm Needle insertion depth: 7 cm Catheter type: closed end flexible Catheter size: 19 Gauge Catheter at skin depth: 13 cm Test dose: negative and 2% lidocaine with Epi 1:200 K  Assessment Events: blood not aspirated, no cerebrospinal fluid, injection not painful, no injection resistance, no paresthesia and negative IV test

## 2023-02-14 NOTE — Progress Notes (Addendum)
Labor Progress Note  Sabrina Rogers is a 37 y.o. [id number] at [redacted]w[redacted]d presented for IOL gHTN.   S: Patient doing much better. Did throw up but has struggled with GERD throughout pregnancy. Feeling better now and sitting upright  O:  BP (!) 143/87   Pulse 65   Temp 99.1 F (37.3 C) (Axillary)   Resp 20   Ht  (1.626 m)   Wt 75.8 kg   LMP 05/29/2022   SpO2 98%   BMI 28.67 kg/m  EFM: 125bpm/Moderate variability/ 15x15 accels/ None decels  CVE: Dilation: 6.5 Effacement (%): 80 Cervical Position: Middle Station: -1 Presentation: Vertex Exam by:: Mechille Varghese CNM   A&P: 37 y.o. [id number] [redacted]w[redacted]d  here for IOL gHTN as above  #Labor: Restart pit  #Pain: Epidural  #FWB: Cat 1 #GBS negative #gHTN: new diagnosis, blood pressures controlled. PEC labs negative. CTM.  #A2GDM: q4hr glucose checks, plan for q2hrs in active labor. SSI #Two-vessel umbilical cord: continue to monitor   Gilles Chiquito PGY-3 02/14/23  4:45 PM  Attestation of CNM Supervision of Resident: Evaluation and management procedures were performed by the [facility] Resident under my supervision. I was immediately available for direct supervision, assistance and direction throughout this encounter.  I also confirm that I have verified the information documented in the resident's note, and that I have also personally reperformed the pertinent components of the physical exam and all of the medical decision making activities.  I have also made any necessary editorial changes.  Pitocin restarted at 10mu/min. Titrate prn per unit policy  Brand Males, CNM 02/14/2023 5:16 PM

## 2023-02-14 NOTE — Anesthesia Preprocedure Evaluation (Signed)
Anesthesia Evaluation  Patient identified by MRN, date of birth, ID band Patient awake    Reviewed: Allergy & Precautions, Patient's Chart, lab work & pertinent test results  History of Anesthesia Complications Negative for: history of anesthetic complications  Airway Mallampati: III  TM Distance: >3 FB Neck ROM: Full    Dental no notable dental hx.    Pulmonary neg pulmonary ROS   breath sounds clear to auscultation       Cardiovascular hypertension,  Rhythm:Regular  gestational   Neuro/Psych negative neurological ROS  negative psych ROS   GI/Hepatic negative GI ROS, Neg liver ROS,,,  Endo/Other  diabetes, Gestational    Renal/GU negative Renal ROS     Musculoskeletal   Abdominal   Peds  Hematology negative hematology ROS (+) Lab Results      Component                Value               Date                      WBC                      5.6                 02/13/2023                HGB                      12.3                02/13/2023                HCT                      35.2 (L)            02/13/2023                MCV                      88.7                02/13/2023                PLT                      166                 02/13/2023              Anesthesia Other Findings   Reproductive/Obstetrics (+) Pregnancy                             Anesthesia Physical Anesthesia Plan  ASA: 2  Anesthesia Plan: Epidural   Post-op Pain Management:    Induction:   PONV Risk Score and Plan: 2 and Treatment may vary due to age or medical condition  Airway Management Planned: Natural Airway  Additional Equipment: None  Intra-op Plan:   Post-operative Plan:   Informed Consent: I have reviewed the patients History and Physical, chart, labs and discussed the procedure including the risks, benefits and alternatives for the proposed anesthesia with the patient or authorized  representative who has indicated his/her understanding and acceptance.  Plan Discussed with:   Anesthesia Plan Comments:        Anesthesia Quick Evaluation

## 2023-02-14 NOTE — Discharge Summary (Signed)
Postpartum Discharge Summary    Patient Name: Sabrina Rogers DOB: 07/02/1986 MRN: [id number]  Date of admission: 02/13/2023 Delivery date:02/14/2023  Delivering provider: Alfredia Ferguson  Date of discharge: 02/16/2023  Admitting diagnosis: Gestational hypertension [O13.9] Intrauterine pregnancy: [redacted]w[redacted]d     Secondary diagnosis:  Principal Problem:   Vaginal delivery Active Problems:   Supervision of other normal pregnancy, antepartum   GDM (gestational diabetes mellitus)   Multigravida of advanced maternal age in third trimester   Gestational hypertension  Additional problems: None    Discharge diagnosis: Term Pregnancy Delivered, Gestational Hypertension, and GDM A2                                              Post partum procedures: none Augmentation: Pitocin and Cytotec Complications: None  Hospital course: Induction of Labor With Vaginal Delivery   37 y.o. yo [id number] at [redacted]w[redacted]d was admitted to the hospital 02/13/2023 for induction of labor.  Indication for induction: Gestational hypertension and A2 DM.  Patient had an labor course complicated by none. Membrane Rupture Time/Date: 7:15 AM ,02/14/2023   Delivery Method:Vaginal, Spontaneous  Episiotomy: None  Lacerations:  1st degree;Periurethral  Details of delivery can be found in separate delivery note.  Patient had a postpartum course complicated by: none. Patient is discharged home 02/16/23.  Newborn Data: Birth date:02/14/2023  Birth time:10:16 PM  Gender:Female  Living status:Living  Apgars:7 ,8  Weight:3060 g   Magnesium Sulfate received: No BMZ received: No Rhophylac:N/A MMR:N/A T-DaP:Given prenatally Flu: N/A Transfusion:No  Physical exam  Vitals:   02/15/23 0937 02/15/23 1349 02/15/23 2035 02/16/23 0438  BP: 119/80 121/82 125/79 100/61  Pulse: 81 79 72 63  Resp: 18 18 18 19   Temp: 98.2 F (36.8 C) 98.5 F (36.9 C) 98.3 F (36.8 C)   TempSrc: Oral Oral Oral   SpO2: 97% 98% 99% 100%  Weight:       Height:       General: alert, cooperative, and no distress Lochia: appropriate Uterine Fundus: firm Incision: N/A DVT Evaluation: No evidence of DVT seen on physical exam. No significant calf/ankle edema. Labs: Lab Results  Component Value Date   WBC 12.8 (H) 02/15/2023   HGB 11.3 (L) 02/15/2023   HCT 33.2 (L) 02/15/2023   MCV 91.0 02/15/2023   PLT 165 02/15/2023      Latest Ref Rng & Units 02/13/2023   11:37 AM  CMP  Glucose 70 - 99 mg/dL 956   BUN 6 - 20 mg/dL 13   Creatinine 2.13 - 1.00 mg/dL 0.86   Sodium 578 - 469 mmol/L 134   Potassium 3.5 - 5.1 mmol/L 3.9   Chloride 98 - 111 mmol/L 105   CO2 22 - 32 mmol/L 19   Calcium 8.9 - 10.3 mg/dL 9.5   Total Protein 6.5 - 8.1 g/dL 6.3   Total Bilirubin 0.3 - 1.2 mg/dL 0.5   Alkaline Phos 38 - 126 U/L 134   AST 15 - 41 U/L 22   ALT 0 - 44 U/L 18    Edinburgh Score:    02/15/2023    9:37 AM  Edinburgh Postnatal Depression Scale Screening Tool  I have been able to laugh and see the funny side of things. 0  I have looked forward with enjoyment to things. 0  I have blamed myself unnecessarily when things went wrong.  1  I have been anxious or worried for no good reason. 1  I have felt scared or panicky for no good reason. 0  Things have been getting on top of me. 0  I have been so unhappy that I have had difficulty sleeping. 0  I have felt sad or miserable. 0  I have been so unhappy that I have been crying. 0  The thought of harming myself has occurred to me. 0  Edinburgh Postnatal Depression Scale Total 2     After visit meds:  Allergies as of 02/16/2023   No Known Allergies      Medication List     STOP taking these medications    Accu-Chek Guide test strip Generic drug: glucose blood   Accu-Chek Softclix Lancets lancets   aspirin EC 81 MG tablet   blood glucose meter kit and supplies Kit   insulin glargine 100 UNIT/ML Solostar Pen Commonly known as: LANTUS   Insulin Pen Needle 32G X 4 MM Misc        TAKE these medications    furosemide 20 MG tablet Commonly known as: LASIX Take 1 tablet (20 mg total) by mouth daily for 5 days. Start taking on: February 17, 2023   ibuprofen 600 MG tablet Commonly known as: ADVIL Take 1 tablet (600 mg total) by mouth every 8 (eight) hours as needed. Take with meals   multivitamin-prenatal 27-0.8 MG Tabs tablet Take 1 tablet by mouth daily at 12 noon.   senna-docusate 8.6-50 MG tablet Commonly known as: Senokot-S Take 2 tablets by mouth daily as needed for mild constipation.         Discharge home in stable condition Infant Feeding: Breast Infant Disposition:home with mother Discharge instruction: per After Visit Summary and Postpartum booklet. Activity: Advance as tolerated. Pelvic rest for 6 weeks.  Diet: routine diet Future Appointments: Future Appointments  Date Time Provider Department Center  02/19/2023  8:50 AM Lennart Pall, MD [facility]  03/30/2023  8:10 AM Donette Larry, CNM [facility]   Follow up Visit:  The following message was sent to [facility] by Gwenyth Allegra, MD  Please schedule this patient for a In person postpartum visit in 6 weeks with the following provider: Any provider. Additional Postpartum F/U:2 hour GTT and BP check 1 week  High risk pregnancy complicated by: GDM and HTN Delivery mode:  Vaginal, Spontaneous  Anticipated Birth Control:  IUD outpatient.   Sheppard Evens MD MPH OB Fellow, Faculty Practice [facility], Center for [facility] 02/16/2023

## 2023-02-14 NOTE — Progress Notes (Signed)
Labor Progress Note Sabrina Rogers is a 37 y.o. [id number] at [redacted]w[redacted]d presented for IOL d/t HTN.   S: doing well, feeling intermittent cramps from contractions.  O:  BP 133/85   Pulse 63   Temp 97.6 F (36.4 C) (Oral)   Resp 16   Ht  (1.626 m)   Wt 75.8 kg   LMP 05/29/2022   SpO2 99%   BMI 28.67 kg/m   EFM: 130 bpm, moderate variability, + accelerations, no decelerations Contractions: about every 4 mins  CVE: Dilation: 1 Effacement (%): 60 Station: -3, -2 Presentation: Vertex Exam by:: Esther Hardy RN   A&P: 37 y.o. [id number] [redacted]w[redacted]d IOL d/t HTN.  #Labor: IOL contractions. S/p cytotec X 2 rounds. Would still like to hold off FB; recheck in 4 hrs and determine next steps. #Pain: Epidural  #FWB: Cat 1, continue to monitor throughout labor  #GBS negative #gHTN: new diagnosis, blood pressures controlled. PEC labs negative. CTM.  #A2GDM: q4hr glucose checks, plan for q2hrs in active labor. SSI  Sheppard Evens MD MPH OB Fellow, Faculty Practice [facility], Center for [facility] 02/14/2023

## 2023-02-14 NOTE — Progress Notes (Signed)
Labor Progress Note Sabrina Rogers is a 37 y.o. [id number] at [redacted]w[redacted]d presented for IOL d/t HTN.   S: Doing well, currently resting comfortably in bed. States that she is beginning to feel more contractions now after beginning Cytotec.   O:  BP 110/81   Pulse 72   Temp 97.6 F (36.4 C) (Oral)   Resp 18   Ht  (1.626 m)   Wt 75.8 kg   LMP 05/29/2022   SpO2 99%   BMI 28.67 kg/m  EFM: 135 bpm/6-25 bpm/15x15 accels/none decels  CVE: Dilation: Fingertip Effacement (%): Thick Station: -3 Presentation: Vertex Exam by:: Esther Hardy RN   A&P: 37 y.o. [id number] [redacted]w[redacted]d IOL d/t HTN.  #Labor: Progressing well, beginning to feel more contractions. S/p dual Cytotec . Redose Cytotec q4h for cervical ripening. Discussed with pt placing FB- she would like to avoid this, if possible. We will closely monitor her progression and assess need for this at next check.  #Pain: Epidural  #FWB: Cat 1, continue to monitor throughout labor  #GBS negative #gHTN: Recent mild elevations to 140s/80s. Pressures have normalized and are appropriate currently. Recent PEC labs wnl. Will monitor throughout labor for elevations.  #A2GDM: Remains on Lantus 12u. SSI as appropriate with BG monitoring q4h in latent and q2h in active labor.   Rosario Adie, MS3 [facility] of Medicine  02/14/23 12:56 AM

## 2023-02-14 NOTE — Progress Notes (Addendum)
Labor Progress Note  Sabrina Rogers is a 37 y.o. [id number] at [redacted]w[redacted]d presented for IOL gHTN.   S: Brought to the room for recurrent variable decels. Turned down pit from 12 to 6 without resolution. Discussed with patient IUPC with amnio and she is agreeable. Pit was turned off due to continued variable decels after amnio. Continued to observe until resolution.   O:  BP 132/81   Pulse 64   Temp 99 F (37.2 C) (Axillary)   Resp 20   Ht  (1.626 m)   Wt 75.8 kg   LMP 05/29/2022   SpO2 99%   BMI 28.67 kg/m  EFM: 140bpm/Moderate variability/ 15x15 accels/ Variable decels  CVE: Dilation: 5.5 Effacement (%): 70 Cervical Position: Middle Station: -1 Presentation: Vertex Exam by:: Polos RN   A&P: 37 y.o. [id number] [redacted]w[redacted]d  here for IOL gHTN as above  #Labor: S/p cytotec X 2 rounds (last 0330). Pit titrated to 12 mU although recurrent variable decels. Recurrent variables required IUPC placement, amnioinfusion, stopping pit (total pit time: 0900-1400). FHT responded and did not require terbutaline. Will continue to monitor for now  #Pain: Epidural  #FWB: Cat 2 #GBS negative #gHTN: new diagnosis, blood pressures controlled. PEC labs negative. CTM.  #A2GDM: q4hr glucose checks, plan for q2hrs in active labor. SSI #Two-vessel umbilical cord: continue to monitor   Gilles Chiquito PGY-3 02/14/23  2:58 PM   Attestation of CNM Supervision of Resident: Evaluation and management procedures were performed by the [facility] Resident under my supervision. I was immediately available for direct supervision, assistance and direction throughout this encounter.  I also confirm that I have verified the information documented in the resident's note, and that I have also personally reperformed the pertinent components of the physical exam and all of the medical decision making activities.  I have also made any necessary editorial changes.  Called to room for recurrent variable decelerations. Pitocin  cut in half initially, IVF bolus given and frequent position changes. These interventions did not resolve the recurrent variables so Pitocin was discontinued, IUPC placed and amnioinfusion started. Will plan to restart Pitocin in 1-2 hours as long as fetal tracing is reassuring.   Brand Males, CNM 02/14/2023 5:14 PM

## 2023-02-14 NOTE — Progress Notes (Signed)
Sabrina Rogers is a 37 y.o. [phone] at [redacted]w[redacted]d admitted for IOL in the setting of gHTN.  Subjective: Sabrina Rogers is doing well this morning. She reports feeling mild-moderate cramping/contractions. She denies headache, vision changes, or RUQ/epigastric pain.   Objective: BP 136/84   Pulse 81   Temp 97.9 F (36.6 C) (Oral)   Resp 20   Ht  (1.626 m)   Wt 75.8 kg   LMP 05/29/2022   SpO2 99%   BMI 28.67 kg/m  No intake/output data recorded. No intake/output data recorded.  FHT: 135 bpm, moderate variability, +15x15 accels, no decels UC: Q 2-30mins SVE:   Dilation: 1 Effacement (%): 60 Station: -3, -2 Exam by:: Esther Hardy RN  Labs: Lab Results  Component Value Date   WBC 5.6 02/13/2023   HGB 12.3 02/13/2023   HCT 35.2 (L) 02/13/2023   MCV 88.7 02/13/2023   PLT 166 02/13/2023    Assessment / Plan: Sabrina Rogers is a 37 y.o. [id number] at [redacted]w[redacted]d admitted for IOL in the setting of gHTN  Labor: Start low dose Pitocin and titrate prn per unit policy gHTN: BP's stable. Labs normal. Patient asymptomatic. Continue to monitor.  A2GDM: Q4H CBGs, most recent was 80. Change to [facility] during active labor.  Fetal Wellbeing:  Category I Pain Control:  prn per patient request I/D:  GBS neg Anticipated MOD:  NSVD   Brand Males, CNM 02/14/2023, 9:33 AM

## 2023-02-15 ENCOUNTER — Encounter (HOSPITAL_COMMUNITY): Payer: Self-pay | Admitting: Family Medicine

## 2023-02-15 LAB — CBC
HCT: 33.2 % — ABNORMAL LOW (ref 36.0–46.0)
Hemoglobin: 11.3 g/dL — ABNORMAL LOW (ref 12.0–15.0)
MCH: 31 pg (ref 26.0–34.0)
MCHC: 34 g/dL (ref 30.0–36.0)
MCV: 91 fL (ref 80.0–100.0)
Platelets: 165 10*3/uL (ref 150–400)
RBC: 3.65 MIL/uL — ABNORMAL LOW (ref 3.87–5.11)
RDW: 13.1 % (ref 11.5–15.5)
WBC: 12.8 10*3/uL — ABNORMAL HIGH (ref 4.0–10.5)
nRBC: 0 % (ref 0.0–0.2)

## 2023-02-15 LAB — GLUCOSE, CAPILLARY
Glucose-Capillary: 130 mg/dL — ABNORMAL HIGH (ref 70–99)
Glucose-Capillary: 144 mg/dL — ABNORMAL HIGH (ref 70–99)

## 2023-02-15 MED ORDER — ZOLPIDEM TARTRATE 5 MG PO TABS: 5.0000 mg | ORAL_TABLET | Freq: Every evening | ORAL | Status: DC | PRN

## 2023-02-15 MED ORDER — SIMETHICONE 80 MG PO CHEW: 80.0000 mg | CHEWABLE_TABLET | ORAL | Status: DC | PRN

## 2023-02-15 MED ORDER — ONDANSETRON HCL 4 MG/2ML IJ SOLN: 4.0000 mg | INTRAMUSCULAR | Status: DC | PRN

## 2023-02-15 MED ORDER — FUROSEMIDE 20 MG PO TABS
20.0000 mg | ORAL_TABLET | Freq: Every day | ORAL | Status: DC
  Administered 2023-02-15 – 2023-02-16 (×2): 20 mg via ORAL
  Filled 2023-02-15 (×2): qty 1

## 2023-02-15 MED ORDER — WITCH HAZEL-GLYCERIN EX PADS
1.0000 | MEDICATED_PAD | CUTANEOUS | Status: DC | PRN
  Administered 2023-02-15: 1 via TOPICAL

## 2023-02-15 MED ORDER — SODIUM CHLORIDE 0.9% FLUSH: 3.0000 mL | INTRAVENOUS | Status: DC | PRN

## 2023-02-15 MED ORDER — SODIUM CHLORIDE 0.9 % IV SOLN: INTRAVENOUS | Status: DC | PRN

## 2023-02-15 MED ORDER — DIBUCAINE (PERIANAL) 1 % EX OINT
1.0000 | TOPICAL_OINTMENT | CUTANEOUS | Status: DC | PRN
  Administered 2023-02-15: 1 via RECTAL
  Filled 2023-02-15: qty 28

## 2023-02-15 MED ORDER — ONDANSETRON HCL 4 MG PO TABS: 4.0000 mg | ORAL_TABLET | ORAL | Status: DC | PRN

## 2023-02-15 MED ORDER — DIPHENHYDRAMINE HCL 25 MG PO CAPS: 25.0000 mg | ORAL_CAPSULE | Freq: Four times a day (QID) | ORAL | Status: DC | PRN

## 2023-02-15 MED ORDER — PRENATAL MULTIVITAMIN CH
1.0000 | ORAL_TABLET | Freq: Every day | ORAL | Status: DC
  Administered 2023-02-15 – 2023-02-16 (×2): 1 via ORAL
  Filled 2023-02-15 (×2): qty 1

## 2023-02-15 MED ORDER — IBUPROFEN 600 MG PO TABS
600.0000 mg | ORAL_TABLET | Freq: Four times a day (QID) | ORAL | Status: DC
  Administered 2023-02-15 – 2023-02-16 (×6): 600 mg via ORAL
  Filled 2023-02-15 (×6): qty 1

## 2023-02-15 MED ORDER — SENNOSIDES-DOCUSATE SODIUM 8.6-50 MG PO TABS
2.0000 | ORAL_TABLET | ORAL | Status: DC
  Administered 2023-02-15 – 2023-02-16 (×2): 2 via ORAL
  Filled 2023-02-15 (×2): qty 2

## 2023-02-15 MED ORDER — ACETAMINOPHEN 325 MG PO TABS: 650.0000 mg | ORAL_TABLET | ORAL | Status: DC | PRN

## 2023-02-15 MED ORDER — OXYCODONE HCL 5 MG PO TABS: 5.0000 mg | ORAL_TABLET | ORAL | Status: DC | PRN

## 2023-02-15 MED ORDER — BENZOCAINE-MENTHOL 20-0.5 % EX AERO: 1.0000 | INHALATION_SPRAY | CUTANEOUS | Status: DC | PRN

## 2023-02-15 MED ORDER — SODIUM CHLORIDE 0.9% FLUSH: 3.0000 mL | Freq: Two times a day (BID) | INTRAVENOUS | Status: DC

## 2023-02-15 MED ORDER — COCONUT OIL OIL: 1.0000 | TOPICAL_OIL | Status: DC | PRN

## 2023-02-15 NOTE — Progress Notes (Signed)
POSTPARTUM PROGRESS NOTE  Post Partum Day 1  Subjective:  Sabrina Rogers is a 37 y.o. [id number] s/p VD IOL for gHTN at [redacted]w[redacted]d.  She reports she is doing well. No acute events overnight. She denies any problems with voiding or po intake. She has been ambulating around her room this morning. Denies nausea or vomiting. Pain is well controlled. Using ice pack for periurethral tears but states she has no pain this morning. Lochia is adequate.  Objective: Blood pressure 116/80, pulse 72, temperature 98.2 F (36.8 C), temperature source Oral, resp. rate 18, height  (1.626 m), weight 75.8 kg, last menstrual period 05/29/2022, SpO2 98 %, unknown if currently breastfeeding.  Physical Exam:  General: alert, cooperative and no distress Chest: no respiratory distress Heart:regular rate, distal pulses intact Abdomen: soft, nontender,  Uterine Fundus: firm, appropriately tender DVT Evaluation: No calf swelling or tenderness Extremities: No edema Skin: warm, dry  Recent Labs    02/13/23 1137 02/15/23 0022  HGB 12.3 11.3*  HCT 35.2* 33.2*    Assessment/Plan: Sabrina Rogers is a 37 y.o. [id number] s/p VD IOL for gHTN at [redacted]w[redacted]d   PPD#1 - Doing well  Routine postpartum care  Contraception: IUD - outpt Feeding: Breast Dispo: Plan for discharge tomorrow.   LOS: 2 days   Rosario Adie, MS3 [facility]  02/15/23 7:38 AM

## 2023-02-15 NOTE — Lactation Note (Signed)
This note was copied from a baby's chart. Lactation Consultation Note  Patient Name: Sabrina Rogers [id number] Date: 02/15/2023 Age:36 hours  RN Helmut Muster) on [place],  informed LC that Birth Parent has decided to BF infant, currently using donor breast milk and now is using DEBP.    Maternal Data    Feeding Nipple Type: Slow - flow  LATCH Score                    Lactation Tools Discussed/Used    Interventions    Discharge    Consult Status      Sabrina Rogers 02/15/2023, 9:49 PM

## 2023-02-15 NOTE — Anesthesia Postprocedure Evaluation (Signed)
Anesthesia Post Note  Patient: Carnetta Losada  Procedure(s) Performed: AN AD HOC LABOR EPIDURAL     Patient location during evaluation: Mother Baby Anesthesia Type: Epidural Level of consciousness: awake, oriented and awake and alert Pain management: pain level controlled Vital Signs Assessment: post-procedure vital signs reviewed and stable Respiratory status: spontaneous breathing, respiratory function stable and nonlabored ventilation Cardiovascular status: stable Postop Assessment: no headache, adequate PO intake, able to ambulate, patient able to bend at knees and no apparent nausea or vomiting Anesthetic complications: no   No notable events documented.  Last Vitals:  Vitals:   02/15/23 0937 02/15/23 1349  BP: 119/80 121/82  Pulse: 81 79  Resp: 18 18  Temp: 36.8 C 36.9 C  SpO2: 97% 98%    Last Pain:  Vitals:   02/15/23 1349  TempSrc: Oral  PainSc: 0-No pain   Pain Goal:                   Yakelin Grenier

## 2023-02-15 NOTE — Lactation Note (Signed)
This note was copied from a baby's chart. Lactation Consultation Note  Patient Name: Sabrina Rogers [id number] Date: 02/15/2023 Age:36 hours   Birth Parent feeding choice is formula only.    Maternal Data    Feeding Nipple Type: Slow - flow  LATCH Score                    Lactation Tools Discussed/Used    Interventions    Discharge    Consult Status Consult Status: Complete    Frederico Hamman 02/15/2023, 3:41 PM

## 2023-02-16 ENCOUNTER — Other Ambulatory Visit: Payer: Medicaid Other

## 2023-02-16 ENCOUNTER — Other Ambulatory Visit (HOSPITAL_COMMUNITY): Payer: Self-pay

## 2023-02-16 LAB — BIRTH TISSUE RECOVERY COLLECTION (PLACENTA DONATION)

## 2023-02-16 LAB — GLUCOSE, CAPILLARY: Glucose-Capillary: 81 mg/dL (ref 70–99)

## 2023-02-16 LAB — RPR: RPR Ser Ql: NONREACTIVE

## 2023-02-16 MED ORDER — SENNOSIDES-DOCUSATE SODIUM 8.6-50 MG PO TABS
2.0000 | ORAL_TABLET | Freq: Every day | ORAL | 0 refills | Status: AC | PRN
  Filled 2023-02-16: qty 30, 15d supply, fill #0

## 2023-02-16 MED ORDER — IBUPROFEN 600 MG PO TABS
600.0000 mg | ORAL_TABLET | Freq: Three times a day (TID) | ORAL | 0 refills | Status: AC | PRN
  Filled 2023-02-16: qty 30, 10d supply, fill #0

## 2023-02-16 MED ORDER — FUROSEMIDE 20 MG PO TABS
20.0000 mg | ORAL_TABLET | Freq: Every day | ORAL | 0 refills | Status: AC
  Filled 2023-02-16: qty 5, 5d supply, fill #0

## 2023-02-16 NOTE — Discharge Instructions (Signed)
-   Continue your prenatal vitamins especially if breastfeeding - Try to eat iron rich food. - Take over the counter tylenol (500mg) or ibuprofen (200mg) three times a day as needed for cramping/pain. - Take water pill (lasix) as prescribed for a total of 5 days. - follow up in clinic in 1 week for a blood pressure check and in 4-6 weeks as scheduled for your regular post partum visit. - Please come back to [facility] if you notice persistently elevated blood pressures or you start to have a headache, that doesn't get better with medications (tylenol and ibuprofen), rest (4hrs of sleep) and drinking water.  

## 2023-02-16 NOTE — Lactation Note (Signed)
This note was copied from a baby's chart. Lactation Consultation Note  Patient Name: Sabrina Rogers [id number] Date: 02/16/2023 Age:36 hours Reason for consult: Initial assessment;Breastfeeding assistance;Mother's request;Difficult latch;Early term 3-38.6wks, See Birth Parent's MR- AMA, GHTN and GDMA2.  P2, ETI female infant, Birth Parent has mostly been formula feeding infant but would like assistance with latching infant at the breast, had latch difficulties and infant doesn't sustain latch, maybe stay less than 2 minutes at the breast. Per Birth Parent, infant was given 30 mls of formula at 12 am but infant was cuing and Birth Parent would like LC assist with how latch infant at the breast. LC ask Birth Parent to do reverse pressure softening prior to latching infant at the breast, to help evert nipple shaft out more, Birth Parent latched infant on her left breast using the football hold position, infant latched with depth and sustained latch for 6 minutes before becoming sleep at the breast. Birth Parent BF infant skin to skin. LC discussed infant's input and output, the importance of maternal rest, diet and hydration. Birth Parent was  made aware of O/P services, breastfeeding support groups, community resources, and our phone # for post-discharge questions.    LC discussed with Birth Parent that her EBM is safe at room temperature for 4 hours whereas formula once open must be used within 1 hour. Birth Parent was given handout" Storage and Preparation of Breast Milk." LC re-fitted Birth Parent with size 21 mm breast flange.   Birth Parent current feeding plan: 1- Birth Parent will do reverse pressure softening prior to latching infant at the breast, continue to BF infant by cues, on demand, 8 to 12+ times within 24 hours, STS. Birth Parent knows to call RN/LC for further latch assistance if needed. 2- Birth Parent will continue to supplement infant with formula her choice after latching infant  at the breast, day 2 offer (7-12 mls) or more if infant wants it. 3- Birth Parent will continue to use DEBP and pump every 3 hours for 15 minutes on initial setting as she work towards infant sustain latch and BF. Birth Parent knows to supplement  infant  with any EBM first before formula    Maternal Data Has patient been taught Hand Expression?: Yes Does the patient have breastfeeding experience prior to this delivery?: Yes How long did the patient breastfeed?: Per Birth Parent, 1st child was in NICU for 2 weeks and she BF for 4 months, initally used NS due to having flat nipples. Birth Parent brought NS with her from home. Would like to try and BF 2nd child without using NS.  Feeding Mother's Current Feeding Choice: Breast Milk and Formula  LATCH Score Latch: Grasps breast easily, tongue down, lips flanged, rhythmical sucking.  Audible Swallowing: A few with stimulation  Type of Nipple: Flat (Birth Parent did reverse pressure softening prior to latching infant at the breast. Infant sustained latch throughout the feeding.)  Comfort (Breast/Nipple): Soft / non-tender  Hold (Positioning): Assistance needed to correctly position infant at breast and maintain latch.  LATCH Score: 7   Lactation Tools Discussed/Used Tools: Pump Breast pump type: Double-Electric Breast Pump Pump Education: Setup, frequency, and cleaning Reason for Pumping: Birth Parent requested , pumping earlier today, set up with DEBP by RN, ETI infant not been latching well at the breast. Birth Parent wants to BF infant. Pumping frequency: Birth Parent plans to continue to pump every 3 hours for 15 minutes on initial setting.  Interventions Interventions: Breast  feeding basics reviewed;Adjust position;Assisted with latch;Support pillows;Skin to skin;Position options;Education;DEBP;Hand express;Reverse pressure;Pre-pump if needed;Breast compression;Hand pump;LC Services brochure  Discharge Pump:  DEBP;Manual  Consult Status Consult Status: Follow-up Date: 02/16/23 Follow-up type: In-patient    Frederico Hamman 02/16/2023, 1:50 AM

## 2023-02-19 ENCOUNTER — Telehealth: Payer: Self-pay | Admitting: *Deleted

## 2023-02-19 ENCOUNTER — Ambulatory Visit: Payer: Medicaid Other | Admitting: Obstetrics and Gynecology

## 2023-02-19 ENCOUNTER — Other Ambulatory Visit: Payer: Medicaid Other

## 2023-02-19 ENCOUNTER — Encounter: Payer: Medicaid Other | Admitting: Obstetrics and Gynecology

## 2023-02-19 NOTE — Telephone Encounter (Signed)
Left patient a message to call and reschedule BP check 1 week.

## 2023-02-22 ENCOUNTER — Ambulatory Visit (INDEPENDENT_AMBULATORY_CARE_PROVIDER_SITE_OTHER): Payer: Medicaid Other | Admitting: Obstetrics & Gynecology

## 2023-02-22 ENCOUNTER — Other Ambulatory Visit: Payer: Medicaid Other

## 2023-02-22 ENCOUNTER — Encounter: Payer: Self-pay | Admitting: Obstetrics & Gynecology

## 2023-02-22 VITALS — BP 119/73 | HR 78 | Wt 154.0 lb

## 2023-02-22 DIAGNOSIS — Z8759 Personal history of other complications of pregnancy, childbirth and the puerperium: Secondary | ICD-10-CM

## 2023-02-22 DIAGNOSIS — Z0130 Encounter for examination of blood pressure without abnormal findings: Secondary | ICD-10-CM | POA: Diagnosis not present

## 2023-02-22 NOTE — Progress Notes (Signed)
   POSTPARTUM OFFICE VISIT NOTE  History:   Sabrina Rogers is a 37 y.o. [id number] s/p vaginal delivery at [redacted]w[redacted]d on 02/14/23 after IOL for GHTN, here for BP check. Normal postpartum BPs, she is on no antihypertensives but did have her Lasix 20 mg po qd x 5 days postpartum.  Patient denies any headaches, visual symptoms, RUQ/epigastric pain or other concerning symptoms. She denies any abnormal vaginal discharge, bleeding, pelvic pain or other concerns.    Past Medical History:  Diagnosis Date   Gestational diabetes    Vaginal Pap smear, abnormal     Past Surgical History:  Procedure Laterality Date   NO PAST SURGERIES      The following portions of the patient's history were reviewed and updated as appropriate: allergies, current medications, past family history, past medical history, past social history, past surgical history and problem list.   Health Maintenance:  Normal pap and negative HRHPV on 11/14/2021  Review of Systems:  Pertinent items noted in HPI and remainder of comprehensive ROS otherwise negative.  Physical Exam:  BP 119/73   Pulse 78   Wt 154 lb (69.9 kg)   LMP 05/29/2022   BMI 26.43 kg/m  CONSTITUTIONAL: Well-developed, well-nourished female in no acute distress.  MUSCULOSKELETAL: Normal range of motion. No edema noted. NEUROLOGIC: Alert and oriented to person, place, and time. Normal muscle tone coordination. No cranial nerve deficit noted. PSYCHIATRIC: Normal mood and affect. Normal behavior. Normal judgment and thought content. CARDIOVASCULAR: Normal heart rate noted RESPIRATORY: Effort and breath sounds normal, no problems with respiration noted ABDOMEN: No masses noted. No other overt distention noted.   PELVIC: Deferred     Assessment and Plan:     1. Postpartum blood pressure check 2. History of gestational hypertension Normal BP. No severe features. No need for any intervention.   Return for Postpartum check as scheduled.     Jaynie Collins, MD,  FACOG Obstetrician & Gynecologist, [facility] for [employer], [facility]

## 2023-02-22 NOTE — Patient Instructions (Signed)
Please premedicate with Tylenol 1000 mg and Ibuprofen 600 mg prior to IUD appointment

## 2023-03-05 ENCOUNTER — Inpatient Hospital Stay (HOSPITAL_COMMUNITY): Admission: AD | Admit: 2023-03-05 | Payer: Medicaid Other | Source: Home / Self Care

## 2023-03-30 ENCOUNTER — Ambulatory Visit (INDEPENDENT_AMBULATORY_CARE_PROVIDER_SITE_OTHER): Payer: Medicaid Other | Admitting: Certified Nurse Midwife

## 2023-03-30 ENCOUNTER — Encounter: Payer: Self-pay | Admitting: Certified Nurse Midwife

## 2023-03-30 DIAGNOSIS — Z30430 Encounter for insertion of intrauterine contraceptive device: Secondary | ICD-10-CM | POA: Diagnosis not present

## 2023-03-30 DIAGNOSIS — Z3202 Encounter for pregnancy test, result negative: Secondary | ICD-10-CM | POA: Diagnosis not present

## 2023-03-30 DIAGNOSIS — O135 Gestational [pregnancy-induced] hypertension without significant proteinuria, complicating the puerperium: Secondary | ICD-10-CM | POA: Diagnosis not present

## 2023-03-30 DIAGNOSIS — O24419 Gestational diabetes mellitus in pregnancy, unspecified control: Secondary | ICD-10-CM

## 2023-03-30 LAB — POCT URINE PREGNANCY: Preg Test, Ur: NEGATIVE

## 2023-03-30 MED ORDER — LEVONORGESTREL 20 MCG/DAY IU IUD
1.0000 | INTRAUTERINE_SYSTEM | Freq: Once | INTRAUTERINE | Status: AC
  Administered 2023-03-30: 1 via INTRAUTERINE

## 2023-03-30 NOTE — Progress Notes (Signed)
Post Partum Visit Note  Sabrina Rogers is a 37 y.o. [phone] female who presents for a postpartum visit. She is 6 weeks postpartum following a normal spontaneous vaginal delivery.  I have fully reviewed the prenatal and intrapartum course. The delivery was at 37.2 gestational weeks.  Anesthesia: epidural. Postpartum course has been unremarkable. Baby is doing well. Baby is feeding by breast. Bleeding no bleeding. Bowel function is normal. Bladder function is normal. Patient is not sexually active. Contraception method is IUD. Postpartum depression screening: negative.   The pregnancy intention screening data noted above was reviewed. Potential methods of contraception were discussed. The patient elected to proceed with No data recorded.   Edinburgh Postnatal Depression Scale - 03/30/23 0816       Edinburgh Postnatal Depression Scale:  In the Past 7 Days   I have been able to laugh and see the funny side of things. 0    I have looked forward with enjoyment to things. 0    I have blamed myself unnecessarily when things went wrong. 0    I have been anxious or worried for no good reason. 0    I have felt scared or panicky for no good reason. 0    Things have been getting on top of me. 0    I have been so unhappy that I have had difficulty sleeping. 0    I have felt sad or miserable. 0    I have been so unhappy that I have been crying. 0    The thought of harming myself has occurred to me. 0    Edinburgh Postnatal Depression Scale Total 0             Health Maintenance Due  Topic Date Due   COVID-19 Vaccine (1) Never done    The following portions of the patient's history were reviewed and updated as appropriate: allergies, current medications, past family history, past medical history, past social history, past surgical history, and problem list.  Review of Systems Pertinent items are noted in HPI.  Objective:  BP 119/85   Pulse 66   Ht 5\' 4"  (1.626 m)   Wt 153 lb (69.4  kg)   LMP 05/29/2022   BMI 26.26 kg/m    General:  alert, cooperative, and no distress   Breasts:    Lungs: NWOB  Heart:  Reg rate  Abdomen: N/a  Wound Labial and periurethral lac well healed, nontender  GU exam:  normal       Procedure Note: Pt consented for IUD insertion.  Pt placed in lithotomy position.  Speculum inserted and cervix cleaned with betadine solution.  Uterus sounded to 8 cm; Mirena IUD inserted without difficulty.  Strings cut at approximately 3 cm.  Speculum removed. Tolerated well.  Assessment:   1. Postpartum examination following vaginal delivery   2. Gestational hypertension without significant proteinuria, postpartum   3. Gestational diabetes mellitus (GDM), antepartum, gestational diabetes method of control unspecified   4. Encounter for IUD insertion     Plan:   Essential components of care per ACOG recommendations:  1.  Mood and well being: Patient with negative depression screening today. Reviewed local resources for support.  - Patient tobacco use? No.   - hx of drug use? No.    2. Infant care and feeding:  -Patient currently breastmilk feeding? yes -Social determinants of health (SDOH) reviewed in EPIC. No concerns  3. Sexuality, contraception and birth spacing - Patient does not want a  pregnancy in the next year.   - Reviewed reproductive life planning. Reviewed contraceptive methods based on pt preferences and effectiveness.  Patient desired IUD or IUS today.   - Discussed birth spacing of 18 months  4. Sleep and fatigue -Encouraged family/partner/community support of 4 hrs of uninterrupted sleep to help with mood and fatigue  5. Physical Recovery  - Discussed patients delivery and complications. She describes her labor as good. - Patient had a Vaginal, no problems at delivery. Patient had a 1st degree laceration. Perineal healing reviewed. Patient expressed understanding - Patient has urinary incontinence? Yes. Discussed role of pelvic  floor PT. Offered PT and patient declined. , reports mild stress incontinence - Patient is safe to resume physical and sexual activity  6.  Health Maintenance - HM due items addressed No -   - Last pap smear  Diagnosis  Date Value Ref Range Status  11/14/2021   Final   - Negative for intraepithelial lesion or malignancy (NILM)   Pap smear not done at today's visit.  -Breast Cancer screening indicated? No.   7. Chronic Disease/Pregnancy Condition follow up:  gHTN: resolved, follow up wPCP for yearly surveillance GDM: GTT today  - PCP follow up  Donette Larry, CNM [facility] [employer], [facility]

## 2023-03-31 LAB — GLUCOSE TOLERANCE, 2 HOURS W/ 1HR
Glucose, 1 hour: 130 mg/dL (ref 70–179)
Glucose, 2 hour: 105 mg/dL (ref 70–152)
Glucose, Fasting: 91 mg/dL (ref 70–91)

## 2023-04-20 ENCOUNTER — Other Ambulatory Visit: Payer: Self-pay

## 2024-03-27 ENCOUNTER — Telehealth: Payer: Self-pay | Admitting: *Deleted

## 2024-03-27 NOTE — Telephone Encounter (Signed)
 Patient will cal back to schedule annual after checking insurance.

## 2024-07-24 ENCOUNTER — Telehealth: Payer: Self-pay | Admitting: *Deleted

## 2024-07-24 NOTE — Telephone Encounter (Signed)
 Returned call from 1:56 PM. Left patient a message to call for self pay pricing per request.

## 2024-07-25 ENCOUNTER — Telehealth: Payer: Self-pay | Admitting: *Deleted

## 2024-07-25 NOTE — Telephone Encounter (Signed)
 Returned call from 4:21 PM. Patient wanted advice of an appointment. Patient will call back to schedule when and if needed.
